# Patient Record
Sex: Female | Born: 1975 | Race: White | Hispanic: Yes | Marital: Single | Smoking: Current some day smoker
Health system: Southern US, Community
[De-identification: ages and names within clinical notes are randomized; demographics above are authoritative.]

## PROBLEM LIST (undated history)

## (undated) HISTORY — PX: TUBAL LIGATION: SHX77

---

## 2014-03-12 ENCOUNTER — Emergency Department: Payer: Self-pay | Admitting: Emergency Medicine

## 2014-03-21 ENCOUNTER — Emergency Department: Payer: Self-pay | Admitting: Emergency Medicine

## 2015-07-17 ENCOUNTER — Ambulatory Visit: Payer: Self-pay | Admitting: General Surgery

## 2015-07-30 ENCOUNTER — Ambulatory Visit: Payer: Self-pay | Admitting: General Surgery

## 2015-09-05 ENCOUNTER — Encounter: Payer: Self-pay | Admitting: *Deleted

## 2015-11-28 ENCOUNTER — Emergency Department: Payer: BLUE CROSS/BLUE SHIELD

## 2015-11-28 ENCOUNTER — Emergency Department
Admission: EM | Admit: 2015-11-28 | Discharge: 2015-11-28 | Disposition: A | Discharge status: Home / Self Care | Payer: BLUE CROSS/BLUE SHIELD | Attending: Student | Admitting: Student

## 2015-11-28 ENCOUNTER — Encounter: Payer: Self-pay | Admitting: *Deleted

## 2015-11-28 DIAGNOSIS — N12 Tubulo-interstitial nephritis, not specified as acute or chronic: Secondary | ICD-10-CM | POA: Diagnosis not present

## 2015-11-28 DIAGNOSIS — R000 Tachycardia, unspecified: Secondary | ICD-10-CM | POA: Insufficient documentation

## 2015-11-28 DIAGNOSIS — R519 Headache, unspecified: Secondary | ICD-10-CM

## 2015-11-28 DIAGNOSIS — R51 Headache: Secondary | ICD-10-CM | POA: Diagnosis not present

## 2015-11-28 DIAGNOSIS — Z3202 Encounter for pregnancy test, result negative: Secondary | ICD-10-CM | POA: Insufficient documentation

## 2015-11-28 DIAGNOSIS — R109 Unspecified abdominal pain: Secondary | ICD-10-CM

## 2015-11-28 LAB — COMPREHENSIVE METABOLIC PANEL
ALBUMIN: 3.7 g/dL (ref 3.5–5.0)
ALK PHOS: 65 U/L (ref 38–126)
ALT: 14 U/L (ref 14–54)
ANION GAP: 9 (ref 5–15)
AST: 17 U/L (ref 15–41)
BUN: 10 mg/dL (ref 6–20)
CALCIUM: 9.2 mg/dL (ref 8.9–10.3)
CO2: 27 mmol/L (ref 22–32)
CREATININE: 0.69 mg/dL (ref 0.44–1.00)
Chloride: 100 mmol/L — ABNORMAL LOW (ref 101–111)
GFR calc Af Amer: >60 mL/min (ref >60)
GFR calc non Af Amer: >60 mL/min (ref >60)
GLUCOSE: 103 mg/dL — AB (ref 65–99)
Potassium: 3.6 mmol/L (ref 3.5–5.1)
SODIUM: 136 mmol/L (ref 135–145)
Total Bilirubin: 1.2 mg/dL (ref 0.3–1.2)
Total Protein: 8 g/dL (ref 6.5–8.1)

## 2015-11-28 LAB — CBC
HCT: 38.5 % (ref 35.0–47.0)
HEMOGLOBIN: 13 g/dL (ref 12.0–16.0)
MCH: 29.7 pg (ref 26.0–34.0)
MCHC: 33.8 g/dL (ref 32.0–36.0)
MCV: 88 fL (ref 80.0–100.0)
Platelets: 201 10*3/uL (ref 150–440)
RBC: 4.38 MIL/uL (ref 3.80–5.20)
RDW: 13.5 % (ref 11.5–14.5)
WBC: 9.5 10*3/uL (ref 3.6–11.0)

## 2015-11-28 LAB — URINALYSIS COMPLETE WITH MICROSCOPIC (ARMC ONLY)
BACTERIA UA: NONE SEEN
Bilirubin Urine: NEGATIVE
Glucose, UA: NEGATIVE mg/dL
KETONES UR: NEGATIVE mg/dL
Nitrite: NEGATIVE
PH: 7 (ref 5.0–8.0)
PROTEIN: NEGATIVE mg/dL
SPECIFIC GRAVITY, URINE: 1.012 (ref 1.005–1.030)

## 2015-11-28 LAB — POCT PREGNANCY, URINE: Preg Test, Ur: NEGATIVE

## 2015-11-28 MED ORDER — ACETAMINOPHEN 500 MG PO TABS
1000.0000 mg | ORAL_TABLET | Freq: Once | ORAL | Status: AC
  Administered 2015-11-28: 1000 mg via ORAL
  Filled 2015-11-28: qty 2

## 2015-11-28 MED ORDER — IBUPROFEN 600 MG PO TABS
600.0000 mg | ORAL_TABLET | Freq: Once | ORAL | Status: AC
  Administered 2015-11-28: 600 mg via ORAL
  Filled 2015-11-28: qty 1

## 2015-11-28 MED ORDER — SODIUM CHLORIDE 0.9 % IV BOLUS (SEPSIS)
1000.0000 mL | Freq: Once | INTRAVENOUS | Status: AC
  Administered 2015-11-28: 1000 mL via INTRAVENOUS

## 2015-11-28 MED ORDER — DEXTROSE 5 % IV SOLN
1.0000 g | INTRAVENOUS | Status: DC
  Administered 2015-11-28: 1 g via INTRAVENOUS
  Filled 2015-11-28: qty 10

## 2015-11-28 MED ORDER — CEPHALEXIN 500 MG PO CAPS: 500.0000 mg | ORAL_CAPSULE | Freq: Two times a day (BID) | ORAL | Status: DC

## 2015-11-28 MED ORDER — DIPHENHYDRAMINE HCL 50 MG/ML IJ SOLN
12.5000 mg | Freq: Once | INTRAMUSCULAR | Status: AC
  Administered 2015-11-28: 12.5 mg via INTRAVENOUS
  Filled 2015-11-28: qty 1

## 2015-11-28 MED ORDER — METOCLOPRAMIDE HCL 5 MG/ML IJ SOLN
10.0000 mg | Freq: Once | INTRAMUSCULAR | Status: AC
  Administered 2015-11-28: 10 mg via INTRAVENOUS
  Filled 2015-11-28: qty 2

## 2015-11-28 NOTE — ED Provider Notes (Signed)
[facility] Emergency Department Provider Note  ____________________________________________  Time seen: Approximately 6:00 PM  I have reviewed the triage vital signs and the nursing notes.   HISTORY  Chief Complaint Headache and Fever  Caveat-history of present illness and review of systems limited due to Spanish language barrier which is overcome with the use of the Spanish interpreter.  HPI Erica Dawson is a 39 y.o. female with no chronic medical problems who presents for evaluation of sudden onset right flank pain, fevers today as well as global headache, gradual onset, constant since onset, currently severe, no modifying factors. Patient reports that she has had right flank pain in the past due to kidney infection. She denies injury, no bowel or bladder incontinence, no history of IV drug use, no history of malignancy, no numbness or weakness in any extremities. She is also complaining of headache and feels as if the pain in her right flank is causing the pain in her head. She specifically denies any neck pain or neck stiffness. No phonophobia or photophobia. No vomiting diarrhea fevers or chills. No chest pain or difficulty breathing.   History reviewed. No pertinent past medical history.  There are no active problems to display for this patient.   History reviewed. No pertinent past surgical history.  No current outpatient prescriptions on file.  Allergies Review of patient's allergies indicates no known allergies.  No family history on file.  Social History Social History  Substance Use Topics  . Smoking status: Never Smoker   . Smokeless tobacco: None  . Alcohol Use: No    Review of Systems Constitutional: + fever/chills Eyes: No visual changes. ENT: No sore throat. Cardiovascular: Denies chest pain. Respiratory: Denies shortness of breath. Gastrointestinal: No abdominal pain.  No nausea, no vomiting.  No diarrhea.  No  constipation. Genitourinary: Positive for flank pain Musculoskeletal: Negative for back pain. Skin: Negative for rash. Neurological: Positive for headaches, no focal weakness or numbness.  10-point ROS otherwise negative.  ____________________________________________   PHYSICAL EXAM:  VITAL SIGNS: ED Triage Vitals  Enc Vitals Group     BP 11/28/15 1655 123/78 mmHg     Pulse Rate 11/28/15 1655 114     Resp 11/28/15 1655 16     Temp 11/28/15 1655 100.3 F (37.9 C)     Temp Source 11/28/15 1655 Oral     SpO2 11/28/15 1655 99 %     Weight 11/28/15 1655 135 lb (61.236 kg)     Height 11/28/15 1655  (1.676 m)     Head Cir --      Peak Flow --      Pain Score 11/28/15 1655 10     Pain Loc --      Pain Edu? --      Excl. in GC? --     Constitutional: Alert and oriented. In mild distress second to pain. Eyes: Conjunctivae are normal. PERRL. EOMI. Head: Atraumatic. Nose: No congestion/rhinnorhea. Mouth/Throat: Mucous membranes are moist.  Oropharynx non-erythematous. Neck: No stridor.  Supple without meningismus. Cardiovascular: mildly tachycardic rate, regular rhythm. Grossly normal heart sounds.  Good peripheral circulation. Respiratory: Normal respiratory effort.  No retractions. Lungs CTAB. Gastrointestinal: Soft and nontender. No distention. + severe right CVA tenderness. Genitourinary: deferred Musculoskeletal: No lower extremity tenderness nor edema.  No joint effusions. Neurologic:  Normal speech and language. No gross focal neurologic deficits are appreciated. 5 out of 5 strength in bilateral upper and lower extremities, sensation intact to light touch throughout.  Skin:  Skin is warm, dry and intact. No rash noted. Psychiatric: Mood and affect are normal. Speech and behavior are normal.  ____________________________________________   LABS (all labs ordered are listed, but only abnormal results are displayed)  Labs Reviewed  COMPREHENSIVE METABOLIC PANEL -  Abnormal; Notable for the following:    Chloride 100 (*)    Glucose, Bld 103 (*)    All other components within normal limits  URINALYSIS COMPLETEWITH MICROSCOPIC ([facility] ONLY) - Abnormal; Notable for the following:    Color, Urine YELLOW (*)    APPearance HAZY (*)    Hgb urine dipstick 1+ (*)    Leukocytes, UA 1+ (*)    Squamous Epithelial / LPF 0-5 (*)    All other components within normal limits  URINE CULTURE  CULTURE, BLOOD (ROUTINE X 2)  CULTURE, BLOOD (ROUTINE X 2)  CBC  POC URINE PREG, ED  POCT PREGNANCY, URINE   ____________________________________________  EKG  none ____________________________________________  RADIOLOGY  CT head IMPRESSION: Negative noncontrast head CT.  CXR IMPRESSION: No acute chest process.  CT abdomen and pelvis IMPRESSION: Right hydroureteronephrosis without focal discrete obstructing stone identified. Differential diagnosis includes recently passed stone versus pyelonephritis. ____________________________________________   PROCEDURES  Procedure(s) performed: None  Critical Care performed: No  ____________________________________________   INITIAL IMPRESSION / ASSESSMENT AND PLAN / ED COURSE  Pertinent labs & imaging results that were available during my care of the patient were reviewed by me and considered in my medical decision making (see chart for details).  Erica Dawson is a 39 y.o. female with no chronic medical problems who presents for evaluation of sudden onset right flank pain, fevers today as well as global headache, gradual onset, constant since onset, currently severe, no modifying factors. On exam, she is in distress due to pain, nearly febrile and midly tachycardic. She has severe right-sided CVA tenderness  - plan UA to evaluate for pyelonephritis. No midline tenderness throughout the back, intact neuro exam, not concerning for cauda equinna and I doubt epidural abscess. Her neck is supple and she  specifically denies neck pain making meningitis less likely. Will treat her pain and obtain labs, UA, CT head. Will likely pursue CT renal protocol of the abdomen and pelvis given her severe right-sided flank pain.  ----------------------------------------- 11:20 PM on 11/28/2015 ----------------------------------------- Patient has had complete resolution of her flank pain and headache at this time after IV fluids, reglan, benadryl and motrin. She is sitting up in bed, requesting discharge. Tachycardia resolved, fever improved. Labs reviewed, normal CBC, CMP, negative pregnancy test, urinalysis was concerning for urinary tract infection or possible obstructing stone so CT of the abdomen and pelvis was obtained and is notable for right hydroureteronephrosis without an obstructing stone however findings could represent a recently passed stone versus pyelonephritis. She received IV ceftriaxone in the ER, she is tolerating by mouth intake. CT head was negative for any acute intracranial process. Chest x-ray clear. Suspect her headache was more related to her fever and general feeling unwell this evening. Clinical picture is not consistent with subarachnoid hemorrhage or meningitis and I do not think she requires a lumbar puncture. We discussed return precautions, plan of care, need for close follow-up with the assistance of this patient to her prior, all questions were answered and she is, with the discharge plan. DC home with keflex. ____________________________________________   FINAL CLINICAL IMPRESSION(S) / ED DIAGNOSES  Final diagnoses:  Acute right flank pain  Pyelonephritis  Acute nonintractable headache, unspecified headache type  Gayla Doss, MD 11/28/15 [phone]

## 2015-11-28 NOTE — ED Notes (Signed)
Patient transported to X-ray 

## 2015-11-28 NOTE — ED Notes (Signed)
Family at bedside. 

## 2015-11-28 NOTE — ED Notes (Signed)
Pt reports sudden onset of headache, fever, and neck /back pain

## 2015-11-28 NOTE — ED Notes (Signed)
Patient transported to CT 

## 2015-11-30 LAB — URINE CULTURE

## 2015-12-03 LAB — CULTURE, BLOOD (ROUTINE X 2)
Culture: NO GROWTH
Culture: NO GROWTH

## 2017-11-29 ENCOUNTER — Emergency Department
Admission: EM | Admit: 2017-11-29 | Discharge: 2017-11-29 | Disposition: A | Discharge status: Home / Self Care | Payer: Self-pay | Attending: Emergency Medicine | Admitting: Emergency Medicine

## 2017-11-29 ENCOUNTER — Emergency Department: Payer: Self-pay

## 2017-11-29 DIAGNOSIS — N83292 Other ovarian cyst, left side: Secondary | ICD-10-CM | POA: Insufficient documentation

## 2017-11-29 DIAGNOSIS — N83209 Unspecified ovarian cyst, unspecified side: Secondary | ICD-10-CM

## 2017-11-29 DIAGNOSIS — R102 Pelvic and perineal pain: Secondary | ICD-10-CM | POA: Insufficient documentation

## 2017-11-29 DIAGNOSIS — F17200 Nicotine dependence, unspecified, uncomplicated: Secondary | ICD-10-CM | POA: Insufficient documentation

## 2017-11-29 LAB — COMPREHENSIVE METABOLIC PANEL
ALBUMIN: 4.1 g/dL (ref 3.5–5.0)
ALT: 20 U/L (ref 14–54)
AST: 20 U/L (ref 15–41)
Alkaline Phosphatase: 72 U/L (ref 38–126)
Anion gap: 10 (ref 5–15)
BUN: 16 mg/dL (ref 6–20)
CHLORIDE: 106 mmol/L (ref 101–111)
CO2: 21 mmol/L — AB (ref 22–32)
CREATININE: 0.55 mg/dL (ref 0.44–1.00)
Calcium: 9 mg/dL (ref 8.9–10.3)
GFR calc Af Amer: >60 mL/min (ref >60)
GFR calc non Af Amer: >60 mL/min (ref >60)
Glucose, Bld: 100 mg/dL — ABNORMAL HIGH (ref 65–99)
Potassium: 4 mmol/L (ref 3.5–5.1)
Sodium: 137 mmol/L (ref 135–145)
Total Bilirubin: 0.5 mg/dL (ref 0.3–1.2)
Total Protein: 8.2 g/dL — ABNORMAL HIGH (ref 6.5–8.1)

## 2017-11-29 LAB — URINALYSIS, COMPLETE (UACMP) WITH MICROSCOPIC
BILIRUBIN URINE: NEGATIVE
Bacteria, UA: NONE SEEN
GLUCOSE, UA: NEGATIVE mg/dL
HGB URINE DIPSTICK: NEGATIVE
Ketones, ur: NEGATIVE mg/dL
Leukocytes, UA: NEGATIVE
Nitrite: NEGATIVE
PROTEIN: NEGATIVE mg/dL
RBC / HPF: NONE SEEN RBC/hpf (ref 0–5)
SPECIFIC GRAVITY, URINE: 1.019 (ref 1.005–1.030)
pH: 5 (ref 5.0–8.0)

## 2017-11-29 LAB — CBC
HCT: 40.7 % (ref 35.0–47.0)
Hemoglobin: 13.7 g/dL (ref 12.0–16.0)
MCH: 29.3 pg (ref 26.0–34.0)
MCHC: 33.7 g/dL (ref 32.0–36.0)
MCV: 87.2 fL (ref 80.0–100.0)
PLATELETS: 248 10*3/uL (ref 150–440)
RBC: 4.67 MIL/uL (ref 3.80–5.20)
RDW: 13.8 % (ref 11.5–14.5)
WBC: 10.4 10*3/uL (ref 3.6–11.0)

## 2017-11-29 LAB — POCT PREGNANCY, URINE: PREG TEST UR: NEGATIVE

## 2017-11-29 LAB — LIPASE, BLOOD: LIPASE: 22 U/L (ref 11–51)

## 2017-11-29 MED ORDER — IBUPROFEN 600 MG PO TABS: 600.0000 mg | ORAL_TABLET | Freq: Three times a day (TID) | ORAL | 0 refills | Status: DC | PRN

## 2017-11-29 MED ORDER — HYDROCODONE-ACETAMINOPHEN 5-325 MG PO TABS: 1.0000 | ORAL_TABLET | Freq: Four times a day (QID) | ORAL | 0 refills | Status: DC | PRN

## 2017-11-29 MED ORDER — IBUPROFEN 400 MG PO TABS
600.0000 mg | ORAL_TABLET | Freq: Once | ORAL | Status: DC
  Filled 2017-11-29: qty 2

## 2017-11-29 MED ORDER — HYDROCODONE-ACETAMINOPHEN 5-325 MG PO TABS
1.0000 | ORAL_TABLET | Freq: Once | ORAL | Status: AC
  Administered 2017-11-29: 1 via ORAL
  Filled 2017-11-29: qty 1

## 2017-11-29 MED ORDER — IBUPROFEN 800 MG PO TABS
800.0000 mg | ORAL_TABLET | Freq: Once | ORAL | Status: AC
  Administered 2017-11-29: 800 mg via ORAL

## 2017-11-29 MED ORDER — ONDANSETRON HCL 4 MG PO TABS: 4.0000 mg | ORAL_TABLET | Freq: Every day | ORAL | 0 refills | Status: AC | PRN

## 2017-11-29 NOTE — ED Notes (Signed)
Upon assessment pt reports lower abdominal pain that has been intermittent for days. Pt reports pain that started this morning at 1030 and has not stopped.

## 2017-11-29 NOTE — ED Triage Notes (Signed)
Pt presents via POV c/o lower abd pain into back. + nausea, denies emesis.

## 2017-11-29 NOTE — ED Provider Notes (Signed)
[facility] Emergency Department Provider Note  ____________________________________________   First MD Initiated Contact with Patient 11/29/17 1459     (approximate)  I have reviewed the triage vital signs and the nursing notes.   HISTORY  Chief Complaint Abdominal Pain    HPI Erica Dawson is a 41 y.o. female who self presents to the emergency department with severe left greater than right lower abdominal/pelvic pain that began suddenly at 1030 this morning.  The pain has been completely constant although does tend to come in waves which she describes as similar to her previous period.  She has a past medical history of having her tubes tied.  Her pain is severe and intermittent associated with nausea.  Nonradiating.  Nothing seems to make it better or worse.  History reviewed. No pertinent past medical history.  There are no active problems to display for this patient.   History reviewed. No pertinent surgical history.  Prior to Admission medications   Medication Sig Start Date End Date Taking? Authorizing Provider  cephALEXin (KEFLEX) 500 MG capsule Take 1 capsule (500 mg total) by mouth 2 (two) times daily. 11/28/15   Gayla DossGayle, Eryka A, MD    Allergies Patient has no known allergies.  History reviewed. No pertinent family history.  Social History Social History   Tobacco Use  . Smoking status: Current Some Day Smoker  . Smokeless tobacco: Never Used  Substance Use Topics  . Alcohol use: No  . Drug use: No    Review of Systems Constitutional: No fever/chills Eyes: No visual changes. ENT: No sore throat. Cardiovascular: Denies chest pain. Respiratory: Denies shortness of breath. Gastrointestinal: Positive for abdominal pain.  Positive for nausea, no vomiting.  No diarrhea.  No constipation. Genitourinary: Negative for dysuria. Musculoskeletal: Negative for back pain. Skin: Negative for rash. Neurological: Negative for  headaches, focal weakness or numbness.   ____________________________________________   PHYSICAL EXAM:  VITAL SIGNS: ED Triage Vitals  Enc Vitals Group     BP 11/29/17 1410 129/71     Pulse Rate 11/29/17 1410 84     Resp 11/29/17 1410 20     Temp 11/29/17 1410 98.1 F (36.7 C)     Temp Source 11/29/17 1410 Oral     SpO2 11/29/17 1410 98 %     Weight 11/29/17 1416 157 lb (71.2 kg)     Height 11/29/17 1416 5\' 6"  (1.676 m)     Head Circumference --      Peak Flow --      Pain Score 11/29/17 1415 10     Pain Loc --      Pain Edu? --      Excl. in GC? --     Constitutional: Alert and oriented x4 appears somewhat uncomfortable when sitting nontoxic no diaphoresis speaks in full clear sentences Eyes: PERRL EOMI. Head: Atraumatic. Nose: No congestion/rhinnorhea. Mouth/Throat: No trismus Neck: No stridor.   Cardiovascular: Normal rate, regular rhythm. Grossly normal heart sounds.  Good peripheral circulation. Respiratory: Normal respiratory effort.  No retractions. Lungs CTAB and moving good air Gastrointestinal: Soft nondistended she is tender left lower quadrant with negative Rovsing's no frank peritonitis Musculoskeletal: No lower extremity edema   Neurologic:  Normal speech and language. No gross focal neurologic deficits are appreciated. Skin:  Skin is warm, dry and intact. No rash noted. Psychiatric: Mood and affect are normal. Speech and behavior are normal.    ____________________________________________   DIFFERENTIAL includes but not limited to  Ovarian  torsion, ovarian cyst, diverticulitis, pyelonephritis, nephrolithiasis ____________________________________________   LABS (all labs ordered are listed, but only abnormal results are displayed)  Labs Reviewed  COMPREHENSIVE METABOLIC PANEL - Abnormal; Notable for the following components:      Result Value   CO2 21 (*)    Glucose, Bld 100 (*)    Total Protein 8.2 (*)    All other components within normal  limits  URINALYSIS, COMPLETE (UACMP) WITH MICROSCOPIC - Abnormal; Notable for the following components:   Color, Urine YELLOW (*)    APPearance CLEAR (*)    Squamous Epithelial / LPF 0-5 (*)    All other components within normal limits  LIPASE, BLOOD  CBC  POCT PREGNANCY, URINE  POC URINE PREG, ED    Blood work reviewed by me with no acute disease __________________________________________  EKG   ____________________________________________  RADIOLOGY  Pelvic ultrasound reviewed by me shows hemorrhagic left-sided ovarian cyst with good blood flow ____________________________________________   PROCEDURES  Procedure(s) performed: no  Procedures  Critical Care performed: no  Observation: no ____________________________________________   INITIAL IMPRESSION / ASSESSMENT AND PLAN / ED COURSE  Pertinent labs & imaging results that were available during my care of the patient were reviewed by me and considered in my medical decision making (see chart for details).  On arrival the patient has colicky left-sided he does not desire any more children.  She has no frank peritonitis but is quite tender.  Concerns for ovarian pathology.  Ultrasound is pending along with pain medication.  ----------------------------------------- 5:44 PM on 11/29/2017 -----------------------------------------  Fortunately the patient's pain is nearly resolved.  Her ultrasound shows a hemorrhagic left-sided ovarian cyst with good blood flow.  At this point as her pain is well controlled I will treat her symptomatically with opioids and nonsteroidals and refer her back to primary care.  Strict return precautions have been given and the patient verbalized understanding and agreement the plan.      ____________________________________________   FINAL CLINICAL IMPRESSION(S) / ED DIAGNOSES  Final diagnoses:  None      NEW MEDICATIONS STARTED DURING THIS VISIT:  This SmartLink is  deprecated. Use AVSMEDLIST instead to display the medication list for a patient.   Note:  This document was prepared using Dragon voice recognition software and may include unintentional dictation errors.     Merrily Brittleifenbark, [place] Virrueta, MD 11/29/17 1745

## 2017-11-29 NOTE — Discharge Instructions (Signed)
It is normal for your pain the last 2-3 more days.  Please take your pain medication as needed for severe symptoms and establish care with primary care within 1 week for recheck.  Return to the emergency department sooner for any new or worsening symptoms such as worsening pain, fevers or chills, if you cannot eat or drink, or for any other issues whatsoever.  It was a pleasure to take care of you today, and thank you for coming to our emergency department.  If you have any questions or concerns before leaving please ask the nurse to grab me and I'm more than happy to go through your aftercare instructions again.  If you were prescribed any opioid pain medication today such as Norco, Vicodin, Percocet, morphine, hydrocodone, or oxycodone please make sure you do not drive when you are taking this medication as it can alter your ability to drive safely.  If you have any concerns once you are home that you are not improving or are in fact getting worse before you can make it to your follow-up appointment, please do not hesitate to call 911 and come back for further evaluation.  Merrily Brittle, MD  Results for orders placed or performed during the hospital encounter of 11/29/17  Lipase, blood  Result Value Ref Range   Lipase 22 11 - 51 U/L  Comprehensive metabolic panel  Result Value Ref Range   Sodium 137 135 - 145 mmol/L   Potassium 4.0 3.5 - 5.1 mmol/L   Chloride 106 101 - 111 mmol/L   CO2 21 (L) 22 - 32 mmol/L   Glucose, Bld 100 (H) 65 - 99 mg/dL   BUN 16 6 - 20 mg/dL   Creatinine, Ser 1.61 0.44 - 1.00 mg/dL   Calcium 9.0 8.9 - 09.6 mg/dL   Total Protein 8.2 (H) 6.5 - 8.1 g/dL   Albumin 4.1 3.5 - 5.0 g/dL   AST 20 15 - 41 U/L   ALT 20 14 - 54 U/L   Alkaline Phosphatase 72 38 - 126 U/L   Total Bilirubin 0.5 0.3 - 1.2 mg/dL   GFR calc non Af Amer >60 >60 mL/min   GFR calc Af Amer >60 >60 mL/min   Anion gap 10 5 - 15  CBC  Result Value Ref Range   WBC 10.4 3.6 - 11.0 K/uL   RBC 4.67  3.80 - 5.20 MIL/uL   Hemoglobin 13.7 12.0 - 16.0 g/dL   HCT 04.5 40.9 - 81.1 %   MCV 87.2 80.0 - 100.0 fL   MCH 29.3 26.0 - 34.0 pg   MCHC 33.7 32.0 - 36.0 g/dL   RDW 91.4 78.2 - 95.6 %   Platelets 248 150 - 440 K/uL  Urinalysis, Complete w Microscopic  Result Value Ref Range   Color, Urine YELLOW (A) YELLOW   APPearance CLEAR (A) CLEAR   Specific Gravity, Urine 1.019 1.005 - 1.030   pH 5.0 5.0 - 8.0   Glucose, UA NEGATIVE NEGATIVE mg/dL   Hgb urine dipstick NEGATIVE NEGATIVE   Bilirubin Urine NEGATIVE NEGATIVE   Ketones, ur NEGATIVE NEGATIVE mg/dL   Protein, ur NEGATIVE NEGATIVE mg/dL   Nitrite NEGATIVE NEGATIVE   Leukocytes, UA NEGATIVE NEGATIVE   RBC / HPF NONE SEEN 0 - 5 RBC/hpf   WBC, UA 0-5 0 - 5 WBC/hpf   Bacteria, UA NONE SEEN NONE SEEN   Squamous Epithelial / LPF 0-5 (A) NONE SEEN   Mucus PRESENT   Pregnancy, urine POC  Result  Value Ref Range   Preg Test, Ur NEGATIVE NEGATIVE   [place] Transvaginal Non-ob  Result Date: 11/29/2017 CLINICAL DATA:  Colicky left lower quadrant pain. EXAM: TRANSABDOMINAL AND TRANSVAGINAL ULTRASOUND OF PELVIS DOPPLER ULTRASOUND OF OVARIES TECHNIQUE: Both transabdominal and transvaginal ultrasound examinations of the pelvis were performed. Transabdominal technique was performed for global imaging of the pelvis including uterus, ovaries, adnexal regions, and pelvic cul-de-sac. It was necessary to proceed with endovaginal exam following the transabdominal exam to visualize the ovaries and endometrium. Color and duplex Doppler ultrasound was utilized to evaluate blood flow to the ovaries. COMPARISON:  None. FINDINGS: Uterus Measurements: 7.8 x 5.0 x 5.6 cm. Probable posterior fibroid measuring 2.2 x 2.5 x 2.6 cm. Endometrium Thickness: 11 mm.  No focal abnormality visualized. Right ovary Measurements: 2.4 x 0.9 x 2.8 cm. Normal appearance/no adnexal mass. Left ovary Measurements: 3.4 x 1.5 x 2.6 cm. Rounded hypoechoic region measuring 1.5 cm with peripheral  blood flow, likely a corpus luteum cyst. Pulsed Doppler evaluation of both ovaries demonstrates normal low-resistance arterial and venous waveforms. Other findings No abnormal free fluid. IMPRESSION: 1. 1.5 cm rounded hypoechoic region with peripheral blood flow in the left ovary is likely a corpus luteum or hemorrhagic cyst. A 6-12 week follow-up ultrasound could ensure resolution. The left ovary is otherwise normal with arterial and venous blood flow. Normal size. 2. The right ovary is somewhat difficult to evaluate due to its posterior location. However, it demonstrates no significant abnormalities. 3. Probable 2.6 cm fibroid. Electronically Signed   By: Gerome Samavid  Williams III M.D   On: 11/29/2017 17:30   [place] Pelvis Complete  Result Date: 11/29/2017 CLINICAL DATA:  Colicky left lower quadrant pain. EXAM: TRANSABDOMINAL AND TRANSVAGINAL ULTRASOUND OF PELVIS DOPPLER ULTRASOUND OF OVARIES TECHNIQUE: Both transabdominal and transvaginal ultrasound examinations of the pelvis were performed. Transabdominal technique was performed for global imaging of the pelvis including uterus, ovaries, adnexal regions, and pelvic cul-de-sac. It was necessary to proceed with endovaginal exam following the transabdominal exam to visualize the ovaries and endometrium. Color and duplex Doppler ultrasound was utilized to evaluate blood flow to the ovaries. COMPARISON:  None. FINDINGS: Uterus Measurements: 7.8 x 5.0 x 5.6 cm. Probable posterior fibroid measuring 2.2 x 2.5 x 2.6 cm. Endometrium Thickness: 11 mm.  No focal abnormality visualized. Right ovary Measurements: 2.4 x 0.9 x 2.8 cm. Normal appearance/no adnexal mass. Left ovary Measurements: 3.4 x 1.5 x 2.6 cm. Rounded hypoechoic region measuring 1.5 cm with peripheral blood flow, likely a corpus luteum cyst. Pulsed Doppler evaluation of both ovaries demonstrates normal low-resistance arterial and venous waveforms. Other findings No abnormal free fluid. IMPRESSION: 1. 1.5 cm  rounded hypoechoic region with peripheral blood flow in the left ovary is likely a corpus luteum or hemorrhagic cyst. A 6-12 week follow-up ultrasound could ensure resolution. The left ovary is otherwise normal with arterial and venous blood flow. Normal size. 2. The right ovary is somewhat difficult to evaluate due to its posterior location. However, it demonstrates no significant abnormalities. 3. Probable 2.6 cm fibroid. Electronically Signed   By: Gerome Samavid  Williams III M.D   On: 11/29/2017 17:30   [place] Art/ven Flow Abd Pelv Doppler  Result Date: 11/29/2017 CLINICAL DATA:  Colicky left lower quadrant pain. EXAM: TRANSABDOMINAL AND TRANSVAGINAL ULTRASOUND OF PELVIS DOPPLER ULTRASOUND OF OVARIES TECHNIQUE: Both transabdominal and transvaginal ultrasound examinations of the pelvis were performed. Transabdominal technique was performed for global imaging of the pelvis including uterus, ovaries, adnexal regions, and pelvic cul-de-sac. It was necessary  to proceed with endovaginal exam following the transabdominal exam to visualize the ovaries and endometrium. Color and duplex Doppler ultrasound was utilized to evaluate blood flow to the ovaries. COMPARISON:  None. FINDINGS: Uterus Measurements: 7.8 x 5.0 x 5.6 cm. Probable posterior fibroid measuring 2.2 x 2.5 x 2.6 cm. Endometrium Thickness: 11 mm.  No focal abnormality visualized. Right ovary Measurements: 2.4 x 0.9 x 2.8 cm. Normal appearance/no adnexal mass. Left ovary Measurements: 3.4 x 1.5 x 2.6 cm. Rounded hypoechoic region measuring 1.5 cm with peripheral blood flow, likely a corpus luteum cyst. Pulsed Doppler evaluation of both ovaries demonstrates normal low-resistance arterial and venous waveforms. Other findings No abnormal free fluid. IMPRESSION: 1. 1.5 cm rounded hypoechoic region with peripheral blood flow in the left ovary is likely a corpus luteum or hemorrhagic cyst. A 6-12 week follow-up ultrasound could ensure resolution. The left ovary is  otherwise normal with arterial and venous blood flow. Normal size. 2. The right ovary is somewhat difficult to evaluate due to its posterior location. However, it demonstrates no significant abnormalities. 3. Probable 2.6 cm fibroid. Electronically Signed   By: Gerome Samavid  Williams III M.D   On: 11/29/2017 17:30

## 2018-05-12 ENCOUNTER — Ambulatory Visit: Payer: Self-pay

## 2018-07-14 ENCOUNTER — Ambulatory Visit: Payer: Self-pay | Attending: Oncology

## 2020-01-22 ENCOUNTER — Inpatient Hospital Stay
Admission: EM | Admit: 2020-01-22 | Discharge: 2020-01-25 | DRG: 494 | Disposition: A | Discharge status: Home Health | Payer: Self-pay | Attending: Specialist | Admitting: Specialist

## 2020-01-22 ENCOUNTER — Emergency Department: Payer: Self-pay

## 2020-01-22 ENCOUNTER — Other Ambulatory Visit: Payer: Self-pay

## 2020-01-22 DIAGNOSIS — Y92009 Unspecified place in unspecified non-institutional (private) residence as the place of occurrence of the external cause: Secondary | ICD-10-CM

## 2020-01-22 DIAGNOSIS — S82232A Displaced oblique fracture of shaft of left tibia, initial encounter for closed fracture: Principal | ICD-10-CM | POA: Diagnosis present

## 2020-01-22 DIAGNOSIS — F17200 Nicotine dependence, unspecified, uncomplicated: Secondary | ICD-10-CM | POA: Diagnosis present

## 2020-01-22 DIAGNOSIS — W109XXA Fall (on) (from) unspecified stairs and steps, initial encounter: Secondary | ICD-10-CM | POA: Diagnosis present

## 2020-01-22 DIAGNOSIS — S82302A Unspecified fracture of lower end of left tibia, initial encounter for closed fracture: Secondary | ICD-10-CM

## 2020-01-22 DIAGNOSIS — Z419 Encounter for procedure for purposes other than remedying health state, unspecified: Secondary | ICD-10-CM

## 2020-01-22 DIAGNOSIS — Z20822 Contact with and (suspected) exposure to covid-19: Secondary | ICD-10-CM | POA: Diagnosis present

## 2020-01-22 DIAGNOSIS — S82202A Unspecified fracture of shaft of left tibia, initial encounter for closed fracture: Secondary | ICD-10-CM | POA: Diagnosis present

## 2020-01-22 DIAGNOSIS — S82832A Other fracture of upper and lower end of left fibula, initial encounter for closed fracture: Secondary | ICD-10-CM | POA: Diagnosis present

## 2020-01-22 LAB — URINALYSIS, ROUTINE W REFLEX MICROSCOPIC
Bacteria, UA: NONE SEEN
Bilirubin Urine: NEGATIVE
Glucose, UA: NEGATIVE mg/dL
Hgb urine dipstick: NEGATIVE
Ketones, ur: 20 mg/dL — AB
Leukocytes,Ua: NEGATIVE
Nitrite: NEGATIVE
Protein, ur: 100 mg/dL — AB
Specific Gravity, Urine: 1.028 (ref 1.005–1.030)
pH: 6 (ref 5.0–8.0)

## 2020-01-22 LAB — CBC WITH DIFFERENTIAL/PLATELET
Abs Immature Granulocytes: 0.03 10*3/uL (ref 0.00–0.07)
Basophils Absolute: 0 10*3/uL (ref 0.0–0.1)
Basophils Relative: 0 %
Eosinophils Absolute: 0.1 10*3/uL (ref 0.0–0.5)
Eosinophils Relative: 1 %
HCT: 41.2 % (ref 36.0–46.0)
Hemoglobin: 13.3 g/dL (ref 12.0–15.0)
Immature Granulocytes: 0 %
Lymphocytes Relative: 17 %
Lymphs Abs: 1.5 10*3/uL (ref 0.7–4.0)
MCH: 28.7 pg (ref 26.0–34.0)
MCHC: 32.3 g/dL (ref 30.0–36.0)
MCV: 88.8 fL (ref 80.0–100.0)
Monocytes Absolute: 0.7 10*3/uL (ref 0.1–1.0)
Monocytes Relative: 8 %
Neutro Abs: 6.6 10*3/uL (ref 1.7–7.7)
Neutrophils Relative %: 74 %
Platelets: 247 10*3/uL (ref 150–400)
RBC: 4.64 MIL/uL (ref 3.87–5.11)
RDW: 13.2 % (ref 11.5–15.5)
WBC: 9 10*3/uL (ref 4.0–10.5)
nRBC: 0 % (ref 0.0–0.2)

## 2020-01-22 LAB — BASIC METABOLIC PANEL
Anion gap: 9 (ref 5–15)
BUN: 10 mg/dL (ref 6–20)
CO2: 22 mmol/L (ref 22–32)
Calcium: 8.6 mg/dL — ABNORMAL LOW (ref 8.9–10.3)
Chloride: 106 mmol/L (ref 98–111)
Creatinine, Ser: 0.59 mg/dL (ref 0.44–1.00)
GFR calc Af Amer: >60 mL/min (ref >60)
GFR calc non Af Amer: >60 mL/min (ref >60)
Glucose, Bld: 105 mg/dL — ABNORMAL HIGH (ref 70–99)
Potassium: 4 mmol/L (ref 3.5–5.1)
Sodium: 137 mmol/L (ref 135–145)

## 2020-01-22 LAB — PROTIME-INR
INR: 1 (ref 0.8–1.2)
Prothrombin Time: 13.1 seconds (ref 11.4–15.2)

## 2020-01-22 LAB — TYPE AND SCREEN
ABO/RH(D): O POS
Antibody Screen: NEGATIVE

## 2020-01-22 LAB — SARS CORONAVIRUS 2 (TAT 6-24 HRS): SARS Coronavirus 2: NEGATIVE

## 2020-01-22 MED ORDER — PREGABALIN 75 MG PO CAPS
75.0000 mg | ORAL_CAPSULE | ORAL | Status: AC
  Administered 2020-01-23: 75 mg via ORAL
  Filled 2020-01-22: qty 1

## 2020-01-22 MED ORDER — SODIUM CHLORIDE 0.9 % IV SOLN: INTRAVENOUS | Status: DC

## 2020-01-22 MED ORDER — CEFAZOLIN SODIUM-DEXTROSE 2-4 GM/100ML-% IV SOLN: 2.0000 g | INTRAVENOUS | Status: DC

## 2020-01-22 MED ORDER — ONDANSETRON HCL 4 MG/2ML IJ SOLN
4.0000 mg | Freq: Once | INTRAMUSCULAR | Status: AC
  Administered 2020-01-22: 4 mg via INTRAVENOUS
  Filled 2020-01-22: qty 2

## 2020-01-22 MED ORDER — CLINDAMYCIN PHOSPHATE 600 MG/50ML IV SOLN
600.0000 mg | INTRAVENOUS | Status: DC
  Filled 2020-01-22: qty 50

## 2020-01-22 MED ORDER — MORPHINE SULFATE (PF) 4 MG/ML IV SOLN
4.0000 mg | Freq: Once | INTRAVENOUS | Status: AC
  Administered 2020-01-22: 4 mg via INTRAVENOUS
  Filled 2020-01-22: qty 1

## 2020-01-22 MED ORDER — ACETAMINOPHEN-CODEINE 120-12 MG/5ML PO SOLN: 24.0000 mg | ORAL | Status: DC | PRN

## 2020-01-22 MED ORDER — CEFAZOLIN SODIUM-DEXTROSE 2-4 GM/100ML-% IV SOLN
2.0000 g | INTRAVENOUS | Status: AC
  Administered 2020-01-23: 2 g via INTRAVENOUS
  Filled 2020-01-22: qty 100

## 2020-01-22 MED ORDER — HYDROCODONE-ACETAMINOPHEN 5-325 MG PO TABS: 1.0000 | ORAL_TABLET | ORAL | Status: DC | PRN

## 2020-01-22 MED ORDER — POVIDONE-IODINE 10 % EX SWAB: 2.0000 "application " | Freq: Once | CUTANEOUS | Status: DC

## 2020-01-22 MED ORDER — CHLORHEXIDINE GLUCONATE 4 % EX LIQD: 60.0000 mL | Freq: Once | CUTANEOUS | Status: DC

## 2020-01-22 MED ORDER — MORPHINE SULFATE (PF) 2 MG/ML IV SOLN
1.0000 mg | INTRAVENOUS | Status: DC | PRN
  Administered 2020-01-22: 1 mg via INTRAVENOUS
  Filled 2020-01-22: qty 1

## 2020-01-22 MED ORDER — OXYCODONE-ACETAMINOPHEN 5-325 MG PO TABS
1.0000 | ORAL_TABLET | Freq: Once | ORAL | Status: AC
  Administered 2020-01-22: 1 via ORAL
  Filled 2020-01-22: qty 1

## 2020-01-22 NOTE — ED Triage Notes (Signed)
Pt states she missed a step and injured her left ankle today.

## 2020-01-22 NOTE — H&P (Signed)
PREOPERATIVE H&P  Chief Complaint: Left leg pain HPI: Erica Dawson is a 44 y.o. female who presents for preoperative history and physical with a diagnosis of displaced left tibial shaft and fibular shaft fractures.  Patient apparently fell at home during the night and was brought to the emergency room today where exam and x-rays revealed a distal tibial fracture at the junction of the middle and distal thirds with displacement and rotation.  Fracture was reduced under sedation by the emergency room personnel and splinted.  This is an unstable fracture and would be best treated with a intramedullary rodding.  I discussed this with the patient and she agrees to the procedure.    History reviewed. No pertinent past medical history. History reviewed. No pertinent surgical history. Social History   Socioeconomic History  . Marital status: Married    Spouse name: Not on file  . Number of children: Not on file  . Years of education: Not on file  . Highest education level: Not on file  Occupational History  . Not on file  Tobacco Use  . Smoking status: Current Some Day Smoker  . Smokeless tobacco: Never Used  Substance and Sexual Activity  . Alcohol use: No  . Drug use: No  . Sexual activity: Not on file  Other Topics Concern  . Not on file  Social History Narrative  . Not on file   Social Determinants of Health   Financial Resource Strain:   . Difficulty of Paying Living Expenses: Not on file  Food Insecurity:   . Worried About Programme researcher, broadcasting/film/video in the Last Year: Not on file  . Ran Out of Food in the Last Year: Not on file  Transportation Needs:   . Lack of Transportation (Medical): Not on file  . Lack of Transportation (Non-Medical): Not on file  Physical Activity:   . Days of Exercise per Week: Not on file  . Minutes of Exercise per Session: Not on file  Stress:   . Feeling of Stress : Not on file  Social Connections:   . Frequency of Communication with Friends  and Family: Not on file  . Frequency of Social Gatherings with Friends and Family: Not on file  . Attends Religious Services: Not on file  . Active Member of Clubs or Organizations: Not on file  . Attends Banker Meetings: Not on file  . Marital Status: Not on file   No family history on file. No Known Allergies Prior to Admission medications   Not on File     Positive ROS: All other systems have been reviewed and were otherwise negative with the exception of those mentioned in the HPI and as above.  Physical Exam: General: Alert, no acute distress Cardiovascular: No pedal edema. Heart is regular and without murmur.  Respiratory: No cyanosis, no use of accessory musculature. Lungs are clear. GI: No organomegaly, abdomen is soft and non-tender Skin: No lesions in the area of chief complaint Neurologic: Sensation intact distally Psychiatric: Patient is competent for consent with normal mood and affect Lymphatic: No axillary or cervical lymphadenopathy  MUSCULOSKELETAL: Patient is alert and cooperative.  She is lying in the hospital bed.  The left lower extremity is splinted.  Skin is intact.  Neurovascular status is intact distally.  She moves the toes well.  Right lower extremity and upper extremities are normal.  Chest and spine are normal.  Assessment: Displaced left tibia/fibula fractures  Plan: Plan for intramedullary rodding of the  left tibia tomorrow morning.  She will be kept n.p.o. after midnight and will get IV antibiotics preoperatively.  The risks benefits and alternatives were discussed with the patient including but not limited to the risks of nonoperative treatment, versus surgical intervention including infection, bleeding, nerve injury,  blood clots, cardiopulmonary complications, morbidity, mortality, among others, and they were willing to proceed.   [name], MD [phone]   01/22/2020 6:57 PM

## 2020-01-22 NOTE — ED Provider Notes (Signed)
[facility] Emergency Department Provider Note  ____________________________________________   None    (approximate)  I have reviewed the triage vital signs and the nursing notes.   HISTORY  Chief Complaint Ankle Injury    HPI Erica Dawson is a 44 y.o. female patient states she slipped and fell.  Notes about 4 AM this morning.  She is unable to bear weight.  Complaining of ankle and left lower leg pain.  No other injuries reported.    History reviewed. No pertinent past medical history.  Patient Active Problem List   Diagnosis Date Noted  . Closed left tibial fracture 01/22/2020    History reviewed. No pertinent surgical history.  Prior to Admission medications   Not on File    Allergies Patient has no known allergies.  No family history on file.  Social History Social History   Tobacco Use  . Smoking status: Current Some Day Smoker  . Smokeless tobacco: Never Used  Substance Use Topics  . Alcohol use: No  . Drug use: No    Review of Systems  Constitutional: No fever/chills Eyes: No visual changes. ENT: No sore throat. Respiratory: Denies cough Genitourinary: Negative for dysuria. Musculoskeletal: Negative for back pain.  Positive for left leg pain Skin: Negative for rash. Psychiatric: no mood changes,     ____________________________________________   PHYSICAL EXAM:  VITAL SIGNS: ED Triage Vitals  Enc Vitals Group     BP 01/22/20 1247 121/77     Pulse Rate 01/22/20 1247 86     Resp 01/22/20 1247 16     Temp 01/22/20 1247 98.1 F (36.7 C)     Temp Source 01/22/20 1247 Oral     SpO2 01/22/20 1247 100 %     Weight 01/22/20 1246 155 lb (70.3 kg)     Height 01/22/20 1246 5\' 6"  (1.676 m)     Head Circumference --      Peak Flow --      Pain Score 01/22/20 1245 10     Pain Loc --      Pain Edu? --      Excl. in GC? --     Constitutional: Alert and oriented. Well appearing and in no acute  distress. Eyes: Conjunctivae are normal.  Head: Atraumatic. Nose: No congestion/rhinnorhea. Mouth/Throat: Mucous membranes are moist.   Neck:  supple no lymphadenopathy noted Cardiovascular: Normal rate, regular rhythm. Heart sounds are normal Respiratory: Normal respiratory effort.  No retractions, lungs c t a  GU: deferred Musculoskeletal: Decreased range of motion of the left tib-fib.  Foot is rotated out, area near the ankle and proximal area of the lower extremity are both tender, neurovascular is intact  neurologic:  Normal speech and language.  Skin:  Skin is warm, dry and intact. No rash noted. Psychiatric: Mood and affect are normal. Speech and behavior are normal.  ____________________________________________   LABS (all labs ordered are listed, but only abnormal results are displayed)  Labs Reviewed  BASIC METABOLIC PANEL - Abnormal; Notable for the following components:      Result Value   Glucose, Bld 105 (*)    Calcium 8.6 (*)    All other components within normal limits  SARS CORONAVIRUS 2 (TAT 6-24 HRS)  CBC WITH DIFFERENTIAL/PLATELET   ____________________________________________   ____________________________________________  RADIOLOGY  X-ray of the left tib-fib shows fracture of the distal tibia which is shifted and the proximal fibula which is also shifted both are through the diaphysis  ____________________________________________  PROCEDURES  Procedure(s) performed: Saline lock, morphine 4 mg IV x2, Percocet 1 p.o.  Lower extremity was splinted after realigning the lower leg, patient tolerated procedure well, neurovascular post splint application  Procedures    ____________________________________________   INITIAL IMPRESSION / ASSESSMENT AND PLAN / ED COURSE  Pertinent labs & imaging results that were available during my care of the patient were reviewed by me and considered in my medical decision making (see chart for details).    Patient's 43 year old female presents emergency department after a fall.  See HPI  Physical exam shows the patient to be tender on the left lower extremity.  X-ray shows a fracture of the left distal tibia and proximal fibula  Paged Dr. Sabra Heck to let them know it looks like an unstable fracture.  He will be taking her to surgery for tomorrow morning.  He is placing orders for admission.  Conveyed all the information to the patient. Dr. Corky Downs in  to help reduce/realign the lower extremity  Patient's blood work shows CBC and metabolic panel are basically normal. Stat Covid ordered due to admission and surgery.     Erica Dawson was evaluated in Emergency Department on 01/22/2020 for the symptoms described in the history of present illness. She was evaluated in the context of the global COVID-19 pandemic, which necessitated consideration that the patient might be at risk for infection with the SARS-CoV-2 virus that causes COVID-19. Institutional protocols and algorithms that pertain to the evaluation of patients at risk for COVID-19 are in a state of rapid change based on information released by regulatory bodies including the CDC and federal and state organizations. These policies and algorithms were followed during the patient's care in the ED.   As part of my medical decision making, I reviewed the following data within the [place] notes reviewed and incorporated, Interpreter needed, Labs reviewed see above, Old chart reviewed, Radiograph reviewed see above, A consult was requested and obtained from this/these consultant(s) Orthopedics, Evaluated by EM attending Dr. Corky Downs, Notes from prior ED visits and [place] Controlled Substance Database  ____________________________________________   FINAL CLINICAL IMPRESSION(S) / ED DIAGNOSES  Final diagnoses:  Closed fracture of distal end of left tibia, unspecified fracture morphology, initial encounter  Closed  fracture of proximal end of left fibula, unspecified fracture morphology, initial encounter      NEW MEDICATIONS STARTED DURING THIS VISIT:  New Prescriptions   No medications on file     Note:  This document was prepared using Dragon voice recognition software and may include unintentional dictation errors.    Versie Starks, PA-C 01/22/20 1512    Vanessa High Aman[place], MD 01/23/20 1754

## 2020-01-22 NOTE — ED Notes (Signed)
See triage note  Presents s/p fall  States she missed a step about 4 am  Unable to bear wt  Good pulses   Pain is at ankle and lower leg

## 2020-01-22 NOTE — ED Provider Notes (Signed)
.  Ortho Injury Treatment  Date/Time: 01/22/2020 3:12 PM Performed by: Jene Every, MD Authorized by: Jene Every, MD   Consent:    Consent obtained:  Verbal   Consent given by:  Patient   Risks discussed:  Nerve damageInjury location: lower leg Injury type: fracture Fracture type: tibial and fibular shafts Pre-procedure distal perfusion: normal Pre-procedure neurological function: normal Pre-procedure range of motion: normal  Anesthesia: Local anesthesia used: no  Patient sedated: NoManipulation performed: yes Reduction successful: yes Immobilization: splint Splint type: sugar tong Supplies used: Ortho-Glass Post-procedure neurovascular assessment: post-procedure neurovascularly intact Post-procedure distal perfusion: normal Post-procedure neurological function: normal Post-procedure range of motion: normal       Jene Every, MD 01/22/20 1513

## 2020-01-23 ENCOUNTER — Inpatient Hospital Stay: Payer: Self-pay

## 2020-01-23 ENCOUNTER — Inpatient Hospital Stay: Payer: Self-pay | Admitting: Anesthesiology

## 2020-01-23 ENCOUNTER — Encounter: Payer: Self-pay | Admitting: Specialist

## 2020-01-23 ENCOUNTER — Encounter
Admission: EM | Disposition: A | Discharge status: Home Health | Payer: Self-pay | Source: Home / Self Care | Attending: Specialist

## 2020-01-23 HISTORY — PX: TIBIA IM NAIL INSERTION: SHX2516

## 2020-01-23 LAB — CREATININE, SERUM
Creatinine, Ser: 0.6 mg/dL (ref 0.44–1.00)
GFR calc Af Amer: >60 mL/min (ref >60)
GFR calc non Af Amer: >60 mL/min (ref >60)

## 2020-01-23 LAB — CBC
HCT: 41.7 % (ref 36.0–46.0)
Hemoglobin: 13.2 g/dL (ref 12.0–15.0)
MCH: 29.1 pg (ref 26.0–34.0)
MCHC: 31.7 g/dL (ref 30.0–36.0)
MCV: 92.1 fL (ref 80.0–100.0)
Platelets: 222 10*3/uL (ref 150–400)
RBC: 4.53 MIL/uL (ref 3.87–5.11)
RDW: 13.3 % (ref 11.5–15.5)
WBC: 10.5 10*3/uL (ref 4.0–10.5)
nRBC: 0 % (ref 0.0–0.2)

## 2020-01-23 SURGERY — INSERTION, INTRAMEDULLARY ROD, TIBIA
Anesthesia: Spinal | Site: Leg Lower | Laterality: Left

## 2020-01-23 MED ORDER — KETOROLAC TROMETHAMINE 30 MG/ML IJ SOLN
INTRAMUSCULAR | Status: AC
  Filled 2020-01-23: qty 1

## 2020-01-23 MED ORDER — ONDANSETRON HCL 4 MG PO TABS: 4.0000 mg | ORAL_TABLET | Freq: Four times a day (QID) | ORAL | Status: DC | PRN

## 2020-01-23 MED ORDER — METHOCARBAMOL 1000 MG/10ML IJ SOLN
500.0000 mg | Freq: Four times a day (QID) | INTRAVENOUS | Status: DC | PRN
  Filled 2020-01-23: qty 5

## 2020-01-23 MED ORDER — BUPIVACAINE-EPINEPHRINE 0.25% -1:200000 IJ SOLN
INTRAMUSCULAR | Status: DC | PRN
  Administered 2020-01-23: 30 mL

## 2020-01-23 MED ORDER — BUPIVACAINE IN DEXTROSE 0.75-8.25 % IT SOLN
INTRATHECAL | Status: DC | PRN
  Administered 2020-01-23: 1.4 mL via INTRATHECAL

## 2020-01-23 MED ORDER — BISACODYL 10 MG RE SUPP: 10.0000 mg | Freq: Every day | RECTAL | Status: DC | PRN

## 2020-01-23 MED ORDER — ACETAMINOPHEN 10 MG/ML IV SOLN: 1000.0000 mg | Freq: Once | INTRAVENOUS | Status: DC | PRN

## 2020-01-23 MED ORDER — SODIUM CHLORIDE 0.9 % IV SOLN
INTRAVENOUS | Status: DC | PRN
  Administered 2020-01-23: 10 ug/min via INTRAVENOUS

## 2020-01-23 MED ORDER — ONDANSETRON HCL 4 MG/2ML IJ SOLN: 4.0000 mg | Freq: Four times a day (QID) | INTRAMUSCULAR | Status: DC | PRN

## 2020-01-23 MED ORDER — SODIUM CHLORIDE 0.45 % IV SOLN: INTRAVENOUS | Status: DC

## 2020-01-23 MED ORDER — METOCLOPRAMIDE HCL 10 MG PO TABS: 5.0000 mg | ORAL_TABLET | Freq: Three times a day (TID) | ORAL | Status: DC | PRN

## 2020-01-23 MED ORDER — ACETAMINOPHEN 325 MG PO TABS
325.0000 mg | ORAL_TABLET | Freq: Four times a day (QID) | ORAL | Status: DC | PRN
  Administered 2020-01-24: 650 mg via ORAL
  Filled 2020-01-23 (×2): qty 2

## 2020-01-23 MED ORDER — FENTANYL CITRATE (PF) 100 MCG/2ML IJ SOLN: 25.0000 ug | INTRAMUSCULAR | Status: DC | PRN

## 2020-01-23 MED ORDER — MAGNESIUM HYDROXIDE 400 MG/5ML PO SUSP: 30.0000 mL | Freq: Every day | ORAL | Status: DC | PRN

## 2020-01-23 MED ORDER — GABAPENTIN 300 MG PO CAPS
300.0000 mg | ORAL_CAPSULE | Freq: Three times a day (TID) | ORAL | Status: DC
  Administered 2020-01-23 – 2020-01-25 (×6): 300 mg via ORAL
  Filled 2020-01-23 (×6): qty 1

## 2020-01-23 MED ORDER — ONDANSETRON HCL 4 MG/2ML IJ SOLN: 4.0000 mg | Freq: Once | INTRAMUSCULAR | Status: DC | PRN

## 2020-01-23 MED ORDER — PROPOFOL 500 MG/50ML IV EMUL
INTRAVENOUS | Status: AC
  Filled 2020-01-23: qty 50

## 2020-01-23 MED ORDER — NEOMYCIN-POLYMYXIN B GU 40-200000 IR SOLN
Status: DC | PRN
  Administered 2020-01-23: 4 mL

## 2020-01-23 MED ORDER — MIDAZOLAM HCL 2 MG/2ML IJ SOLN
INTRAMUSCULAR | Status: AC
  Filled 2020-01-23: qty 2

## 2020-01-23 MED ORDER — NEOMYCIN-POLYMYXIN B GU 40-200000 IR SOLN
Status: AC
  Filled 2020-01-23: qty 20

## 2020-01-23 MED ORDER — PROPOFOL 500 MG/50ML IV EMUL
INTRAVENOUS | Status: DC | PRN
  Administered 2020-01-23: 50 ug/kg/min via INTRAVENOUS

## 2020-01-23 MED ORDER — CELECOXIB 200 MG PO CAPS
200.0000 mg | ORAL_CAPSULE | Freq: Two times a day (BID) | ORAL | Status: DC
  Administered 2020-01-23 – 2020-01-25 (×4): 200 mg via ORAL
  Filled 2020-01-23 (×4): qty 1

## 2020-01-23 MED ORDER — FENTANYL CITRATE (PF) 100 MCG/2ML IJ SOLN
INTRAMUSCULAR | Status: AC
  Filled 2020-01-23: qty 2

## 2020-01-23 MED ORDER — OXYCODONE HCL 5 MG/5ML PO SOLN: 5.0000 mg | Freq: Once | ORAL | Status: DC | PRN

## 2020-01-23 MED ORDER — METOCLOPRAMIDE HCL 5 MG/ML IJ SOLN: 5.0000 mg | Freq: Three times a day (TID) | INTRAMUSCULAR | Status: DC | PRN

## 2020-01-23 MED ORDER — ONDANSETRON HCL 4 MG/2ML IJ SOLN
INTRAMUSCULAR | Status: DC | PRN
  Administered 2020-01-23: 4 mg via INTRAVENOUS

## 2020-01-23 MED ORDER — ACETAMINOPHEN 10 MG/ML IV SOLN
INTRAVENOUS | Status: AC
  Filled 2020-01-23: qty 100

## 2020-01-23 MED ORDER — ACETAMINOPHEN 10 MG/ML IV SOLN
INTRAVENOUS | Status: DC | PRN
  Administered 2020-01-23: 1000 mg via INTRAVENOUS

## 2020-01-23 MED ORDER — CHLORHEXIDINE GLUCONATE CLOTH 2 % EX PADS
6.0000 | MEDICATED_PAD | Freq: Every day | CUTANEOUS | Status: DC
  Administered 2020-01-23 – 2020-01-25 (×3): 6 via TOPICAL

## 2020-01-23 MED ORDER — ENOXAPARIN SODIUM 40 MG/0.4ML ~~LOC~~ SOLN
30.0000 mg | SUBCUTANEOUS | Status: DC
  Filled 2020-01-23: qty 0.4

## 2020-01-23 MED ORDER — CEFAZOLIN SODIUM-DEXTROSE 2-4 GM/100ML-% IV SOLN
2.0000 g | Freq: Three times a day (TID) | INTRAVENOUS | Status: AC
  Administered 2020-01-23 – 2020-01-24 (×3): 2 g via INTRAVENOUS
  Filled 2020-01-23 (×3): qty 100

## 2020-01-23 MED ORDER — FLEET ENEMA 7-19 GM/118ML RE ENEM: 1.0000 | ENEMA | Freq: Once | RECTAL | Status: DC | PRN

## 2020-01-23 MED ORDER — METHOCARBAMOL 500 MG PO TABS: 500.0000 mg | ORAL_TABLET | Freq: Four times a day (QID) | ORAL | Status: DC | PRN

## 2020-01-23 MED ORDER — PHENYLEPHRINE HCL (PRESSORS) 10 MG/ML IV SOLN
INTRAVENOUS | Status: DC | PRN
  Administered 2020-01-23: 100 ug via INTRAVENOUS

## 2020-01-23 MED ORDER — EPHEDRINE SULFATE 50 MG/ML IJ SOLN
INTRAMUSCULAR | Status: DC | PRN
  Administered 2020-01-23: 10 mg via INTRAVENOUS

## 2020-01-23 MED ORDER — ONDANSETRON HCL 4 MG/2ML IJ SOLN
INTRAMUSCULAR | Status: AC
  Filled 2020-01-23: qty 2

## 2020-01-23 MED ORDER — FENTANYL CITRATE (PF) 100 MCG/2ML IJ SOLN
INTRAMUSCULAR | Status: DC | PRN
  Administered 2020-01-23 (×2): 50 ug via INTRAVENOUS

## 2020-01-23 MED ORDER — CLINDAMYCIN PHOSPHATE 600 MG/50ML IV SOLN
600.0000 mg | Freq: Three times a day (TID) | INTRAVENOUS | Status: AC
  Administered 2020-01-23 – 2020-01-24 (×3): 600 mg via INTRAVENOUS
  Filled 2020-01-23 (×3): qty 50

## 2020-01-23 MED ORDER — HYDROCODONE-ACETAMINOPHEN 5-325 MG PO TABS
1.0000 | ORAL_TABLET | ORAL | Status: DC | PRN
  Administered 2020-01-23 – 2020-01-24 (×4): 2 via ORAL
  Administered 2020-01-24 – 2020-01-25 (×2): 1 via ORAL
  Filled 2020-01-23: qty 1
  Filled 2020-01-23: qty 2
  Filled 2020-01-23 (×2): qty 1
  Filled 2020-01-23: qty 2
  Filled 2020-01-23: qty 1
  Filled 2020-01-23: qty 2

## 2020-01-23 MED ORDER — PHENYLEPHRINE HCL (PRESSORS) 10 MG/ML IV SOLN
INTRAVENOUS | Status: AC
  Filled 2020-01-23: qty 1

## 2020-01-23 MED ORDER — EPHEDRINE SULFATE 50 MG/ML IJ SOLN
INTRAMUSCULAR | Status: AC
  Filled 2020-01-23: qty 1

## 2020-01-23 MED ORDER — MIDAZOLAM HCL 5 MG/5ML IJ SOLN
INTRAMUSCULAR | Status: DC | PRN
  Administered 2020-01-23: 2 mg via INTRAVENOUS

## 2020-01-23 MED ORDER — DOCUSATE SODIUM 100 MG PO CAPS
100.0000 mg | ORAL_CAPSULE | Freq: Two times a day (BID) | ORAL | Status: DC
  Administered 2020-01-23 – 2020-01-25 (×4): 100 mg via ORAL
  Filled 2020-01-23 (×4): qty 1

## 2020-01-23 MED ORDER — MORPHINE SULFATE (PF) 4 MG/ML IV SOLN
0.5000 mg | INTRAVENOUS | Status: DC | PRN
  Administered 2020-01-23 (×2): 1 mg via INTRAVENOUS
  Filled 2020-01-23 (×2): qty 1

## 2020-01-23 MED ORDER — OXYCODONE HCL 5 MG PO TABS: 5.0000 mg | ORAL_TABLET | Freq: Once | ORAL | Status: DC | PRN

## 2020-01-23 SURGICAL SUPPLY — 45 items
BIT DRILL 3.8X6 NS (BIT) ×2 IMPLANT
BIT DRILL 4.4 NS (BIT) ×2 IMPLANT
CANISTER SUCT 1200ML W/VALVE (MISCELLANEOUS) ×3 IMPLANT
CHLORAPREP W/TINT 26 (MISCELLANEOUS) ×3 IMPLANT
COVER WAND RF STERILE (DRAPES) ×3 IMPLANT
CUFF TOURN SGL QUICK 24 (TOURNIQUET CUFF) ×2
CUFF TOURN SGL QUICK 30 (TOURNIQUET CUFF)
CUFF TRNQT CYL 24X4X16.5-23 (TOURNIQUET CUFF) IMPLANT
CUFF TRNQT CYL 30X4X21-28X (TOURNIQUET CUFF) IMPLANT
DRAPE C-ARM XRAY 36X54 (DRAPES) ×3 IMPLANT
DRAPE C-ARMOR (DRAPES) ×2 IMPLANT
DRAPE U-SHAPE 47X51 STRL (DRAPES) ×3 IMPLANT
DRSG AQUACEL AG ADV 3.5X10 (GAUZE/BANDAGES/DRESSINGS) ×3 IMPLANT
ELECT REM PT RETURN 9FT ADLT (ELECTROSURGICAL) ×3
ELECTRODE REM PT RTRN 9FT ADLT (ELECTROSURGICAL) ×1 IMPLANT
GAUZE SPONGE 4X4 12PLY STRL (GAUZE/BANDAGES/DRESSINGS) ×3 IMPLANT
GAUZE XEROFORM 1X8 LF (GAUZE/BANDAGES/DRESSINGS) ×3 IMPLANT
GLOVE INDICATOR 8.0 STRL GRN (GLOVE) ×3 IMPLANT
GLOVE SURG ORTHO 8.5 STRL (GLOVE) ×3 IMPLANT
GLOVE SURG XRAY 8.0 LX (GLOVE) ×1 IMPLANT
GOWN STRL REUS W/ TWL LRG LVL3 (GOWN DISPOSABLE) ×1 IMPLANT
GOWN STRL REUS W/TWL LRG LVL3 (GOWN DISPOSABLE) ×2
GOWN STRL REUS W/TWL LRG LVL4 (GOWN DISPOSABLE) ×3 IMPLANT
GUIDEPIN 3.2X17.5 THRD DISP (PIN) ×2 IMPLANT
GUIDEWIRE BALL NOSE 100CM (WIRE) ×3 IMPLANT
HEMOVAC 400CC 10FR (MISCELLANEOUS) ×3 IMPLANT
KIT TURNOVER KIT A (KITS) ×3 IMPLANT
NAIL TIBIAL 9MMX31.5CM (Nail) ×2 IMPLANT
NDL SPNL 18GX3.5 QUINCKE PK (NEEDLE) ×1 IMPLANT
NEEDLE SPNL 18GX3.5 QUINCKE PK (NEEDLE) ×3 IMPLANT
NS IRRIG 1000ML POUR BTL (IV SOLUTION) ×3 IMPLANT
PACK TOTAL KNEE (MISCELLANEOUS) ×3 IMPLANT
PAD ABD DERMACEA PRESS 5X9 (GAUZE/BANDAGES/DRESSINGS) ×3 IMPLANT
SCREW ACECAP 32MM (Screw) ×2 IMPLANT
SCREW CORTICAL 5.5 35MM (Screw) ×2 IMPLANT
SCREW PROXIMAL DEPUY (Screw) ×2 IMPLANT
SCREW PRXML FT 45X5.5XLCK NS (Screw) IMPLANT
SPONGE LAP 18X18 RF (DISPOSABLE) ×3 IMPLANT
STAPLER SKIN PROX 35W (STAPLE) ×3 IMPLANT
SUT VIC AB 0 CT1 36 (SUTURE) ×6 IMPLANT
SUT VIC AB 2-0 CT1 27 (SUTURE) ×4
SUT VIC AB 2-0 CT1 TAPERPNT 27 (SUTURE) ×2 IMPLANT
SUT VIC AB 3-0 SH 27 (SUTURE) ×2
SUT VIC AB 3-0 SH 27X BRD (SUTURE) IMPLANT
SYR 10ML LL (SYRINGE) ×3 IMPLANT

## 2020-01-23 NOTE — Anesthesia Preprocedure Evaluation (Signed)
Anesthesia Evaluation  Patient identified by MRN, date of birth, ID band Patient awake    Reviewed: Allergy & Precautions, NPO status , Patient's Chart, lab work & pertinent test results  History of Anesthesia Complications Negative for: history of anesthetic complications  Airway Mallampati: II  TM Distance: >3 FB Neck ROM: Full    Dental no notable dental hx. (+) Teeth Intact   Pulmonary neg pulmonary ROS, neg sleep apnea, neg COPD, Patient abstained from smoking.Not current smoker,    Pulmonary exam normal breath sounds clear to auscultation       Cardiovascular Exercise Tolerance: Good METS(-) hypertension(-) CAD and (-) Past MI negative cardio ROS  (-) dysrhythmias  Rhythm:Regular Rate:Normal - Systolic murmurs    Neuro/Psych negative neurological ROS  negative psych ROS   GI/Hepatic neg GERD  ,(+)     (-) substance abuse  ,   Endo/Other  neg diabetes  Renal/GU negative Renal ROS     Musculoskeletal   Abdominal   Peds  Hematology   Anesthesia Other Findings History reviewed. No pertinent past medical history.  Reproductive/Obstetrics                             Anesthesia Physical Anesthesia Plan  ASA: I  Anesthesia Plan: Spinal   Post-op Pain Management:    Induction: Intravenous  PONV Risk Score and Plan: 3 and TIVA, Midazolam, Propofol infusion, Ondansetron and Dexamethasone  Airway Management Planned: Natural Airway and Nasal Cannula  Additional Equipment:   Intra-op Plan:   Post-operative Plan:   Informed Consent: I have reviewed the patients History and Physical, chart, labs and discussed the procedure including the risks, benefits and alternatives for the proposed anesthesia with the patient or authorized representative who has indicated his/her understanding and acceptance.       Plan Discussed with: CRNA and Surgeon  Anesthesia Plan Comments:  (Discussed R/B/A of neuraxial anesthesia technique with patient: - rare risks of spinal/epidural hematoma, nerve damage, infection - Risk of PDPH - Risk of nausea and vomiting - Risk of conversion to general anesthesia and its associated risks  Patient voiced understanding. Will check urine pregnancy test prior to anesthesia.)        Anesthesia Quick Evaluation

## 2020-01-23 NOTE — Transfer of Care (Signed)
Immediate Anesthesia Transfer of Care Note  Patient: Erica Dawson  Procedure(s) Performed: INTRAMEDULLARY (IM) NAIL TIBIAL (Left Leg Lower)  Patient Location: PACU  Anesthesia Type:Spinal  Level of Consciousness: awake, alert  and oriented  Airway & Oxygen Therapy: Patient Spontanous Breathing  Post-op Assessment: Report given to RN and Post -op Vital signs reviewed and stable  Post vital signs: Reviewed and stable  Last Vitals:  Vitals Value Taken Time  BP 134/94 01/23/20 1328  Temp 36.8 C 01/23/20 1305  Pulse 71 01/23/20 1328  Resp 12 01/23/20 1309  SpO2 100 % 01/23/20 1328  Vitals shown include unvalidated device data.  Last Pain:  Vitals:   01/23/20 1305  TempSrc:   PainSc: 0-No pain      Patients Stated Pain Goal: 0 (01/22/20 2202)  Complications: No apparent anesthesia complications

## 2020-01-23 NOTE — Evaluation (Signed)
Physical Therapy Evaluation Patient Details Name: Erica Dawson MRN: [id number] DOB: [birth year] Today's Date: 01/23/2020   History of Present Illness  Pt is a 44 yo female with no pertinent past medical history who slipped and fell on a wet spot on the floor in her home and was diagnosed with a left tibial fracture.  Pt is now s/p IM nail to the L tibia.    Clinical Impression  Interpreter Leola Brazil utilized during the session.  Pt anxious regarding mobility initially but overall performed very well during the session.  Pt required only min A for LLE control in and out of bed and cues for proper sequencing with amb.  After amb 3' and returning to sitting pt reported feeling that amb was "easy" and requested additional ambulation.  Pt then amb 12' with good carryover of proper sequencing and strict adherence to NWB status on the LLE.  Will attempt stair training during a future session but pt is anticipated to perform well.  Pt will benefit from HHPT services upon discharge to safely address deficits listed in patient problem list for decreased caregiver assistance and eventual return to PLOF.      Follow Up Recommendations Home health PT;Supervision for mobility/OOB    Equipment Recommendations  Rolling walker with 5" wheels;3in1 (PT)    Recommendations for Other Services       Precautions / Restrictions Precautions Precautions: Fall Restrictions Weight Bearing Restrictions: Yes LLE Weight Bearing: Non weight bearing      Mobility  Bed Mobility Overal bed mobility: Needs Assistance Bed Mobility: Supine to Sit;Sit to Supine     Supine to sit: Min assist Sit to supine: Min assist   General bed mobility comments: Min A for LLE in and out of bed  Transfers Overall transfer level: Needs assistance Equipment used: Rolling walker (2 wheeled) Transfers: Sit to/from Stand Sit to Stand: From elevated surface;Min guard         General transfer comment: Mod verbal cues for  sequencing  Ambulation/Gait   Gait Distance (Feet): 12 Feet x 1, 3 Feet x 1 Assistive device: Rolling walker (2 wheeled)   Gait velocity: decreased   General Gait Details: Hop-to gait pattern with good stability and mod verbal cues for proper sequencing with goood carry over  Stairs            Wheelchair Mobility    Modified Rankin (Stroke Patients Only)       Balance Overall balance assessment: Needs assistance   Sitting balance-Leahy Scale: Normal     Standing balance support: Bilateral upper extremity supported Standing balance-Leahy Scale: Good                               Pertinent Vitals/Pain Pain Assessment: 0-10 Pain Score: 9  Pain Location: LLE Pain Descriptors / Indicators: Aching;Sore Pain Intervention(s): Premedicated before session;Monitored during session    [place] expects to be discharged to:: Private residence Living Arrangements: Children Available Help at Discharge: Family;Available 24 hours/day Type of Home: House Home Access: Stairs to enter Entrance Stairs-Rails: None Entrance Stairs-Number of Steps: 3 Home Layout: Two level;Able to live on main level with bedroom/bathroom Home Equipment: None Additional Comments: Pt lives at home with adult son and dtr who are available 24/7    Prior Function Level of Independence: Independent         Comments: Ind amb community distances without an AD, no other fall history, slipped  and fell on wet floor resulting in current admission, Ind with ADLs     Hand Dominance        Extremity/Trunk Assessment   Upper Extremity Assessment Upper Extremity Assessment: Overall WFL for tasks assessed    Lower Extremity Assessment Lower Extremity Assessment: Generalized weakness;LLE deficits/detail LLE Deficits / Details: LLE sensation to light touch intact and able to wiggle toes in splint LLE: Unable to fully assess due to pain LLE Sensation: WNL        Communication   Communication: Interpreter utilized  Cognition Arousal/Alertness: Awake/alert Behavior During Therapy: WFL for tasks assessed/performed Overall Cognitive Status: Within Functional Limits for tasks assessed                                        General Comments      Exercises Total Joint Exercises Ankle Circles/Pumps: AROM;Strengthening;Right;10 reps(toe wiggles on the LLE) Quad Sets: Strengthening;Both;10 reps;5 reps Gluteal Sets: Strengthening;Both;10 reps Hip ABduction/ADduction: AAROM;Left;5 reps Straight Leg Raises: AAROM;Left;5 reps Long Arc Quad: AROM;10 reps;5 reps;Both Knee Flexion: AROM;Both;5 reps;10 reps Other Exercises Other Exercises: HEP education for RLE APs/LLE toe wiggles, BLE QS, and GS x 10 each every 1-2 hours daily   Assessment/Plan    PT Assessment Patient needs continued PT services  PT Problem List Decreased strength;Decreased range of motion;Decreased activity tolerance;Decreased balance;Decreased mobility;Decreased knowledge of use of DME;Decreased safety awareness;Decreased knowledge of precautions;Pain       PT Treatment Interventions DME instruction;Gait training;Stair training;Functional mobility training;Therapeutic activities;Therapeutic exercise;Balance training;Patient/family education    PT Goals (Current goals can be found in the Care Plan section)  Acute Rehab PT Goals Patient Stated Goal: To walk better PT Goal Formulation: With patient Time For Goal Achievement: 02/05/20 Potential to Achieve Goals: Good    Frequency BID   Barriers to discharge        Co-evaluation               AM-PAC PT "6 Clicks" Mobility  Outcome Measure Help needed turning from your back to your side while in a flat bed without using bedrails?: A Little Help needed moving from lying on your back to sitting on the side of a flat bed without using bedrails?: A Little Help needed moving to and from a bed to a chair  (including a wheelchair)?: A Little Help needed standing up from a chair using your arms (e.g., wheelchair or bedside chair)?: A Little Help needed to walk in hospital room?: A Little Help needed climbing 3-5 steps with a railing? : A Lot 6 Click Score: 17    End of Session Equipment Utilized During Treatment: Gait belt Activity Tolerance: Patient tolerated treatment well;No increased pain Patient left: in bed;with family/visitor present;with call bell/phone within reach;with bed alarm set;with SCD's reapplied Nurse Communication: Mobility status;Weight bearing status PT Visit Diagnosis: Other abnormalities of gait and mobility (R26.89);Muscle weakness (generalized) (M62.81);Pain Pain - Right/Left: Left Pain - part of body: Leg    Time: 5784-6962 PT Time Calculation (min) (ACUTE ONLY): 47 min   Charges:   PT Evaluation $PT Eval Moderate Complexity: 1 Mod PT Treatments $Therapeutic Exercise: 8-22 mins       D. Elly Modena PT, DPT 01/23/20, 5:15 PM

## 2020-01-23 NOTE — Plan of Care (Signed)

## 2020-01-23 NOTE — Op Note (Signed)
Expand All Collapse All     01/23/2020  12:19 PM  PATIENT:  Erica Dawson   MRN: [id number]  PRE-OPERATIVE DIAGNOSIS:  Left tibal fracture, short oblique, junction of middle and distal thirds  POST-OPERATIVE DIAGNOSIS: Same  PROCEDURE:  Procedure(s): INTRAMEDULLARY (IM) NAIL TIBIAL LEFT  SURGEON:  Valinda Hoar, MD  ANESTHESIA:   Spinal   PREOPERATIVE INDICATIONS:  The patient  has a diagnosis of displaced and unstable tibia/ fibula fractures who elected for surgical management after discussion with the patient   about the options between surgery and cast management of the fractures. .  The risks benefits and alternatives were discussed with the patient preoperatively including but not limited to the risks of infection, bleeding, nerve injury, cardiopulmonary complications, the need for revision surgery, among others, and the patient was willing to proceed.  OPERATIVE IMPLANTS: Biomet Versanail,   31.5 mm    9 mm  OPERATIVE FINDINGS: Short oblique fracture tibial shaft with proximal fibular fracture  COMPLICATIONS: None  EBL: 50 cc      REPLACED: None  OPERATIVE PROCEDURE: The patient was brought to the operating room and underwent spinal anesthesia without complications and then placed on the operating room table and positioned appropriately.  The operative leg was prepped and draped in a sterile fashion.  Tourniquet was not used.  IV anti-biotics were given.  The leg was placed on the tibial reduction triangle and the foot immobilized with Coban and traction and rotational corrections were made.  A proximal medial incision was made along the patellar tendon.  Dissection was carried out bluntly through subcutaneous tissue and the fascia was divided.  A guidepin was introduced into the proximal tibia under fluoroscopic control and seen to be in good alignment and position.  Large drill was introduced to open the the proximal tibia.  A guidepin was then passed down the  shaft of the tibia and across the fracture site down to the lower end of the tibia.  The shaft was then sequentially reamed to 10.5 mm.  The above listed Versanail was introduced and passed across the fracture site and fully seated in the tibia.  Fluoroscopy showed excellent position of the nail and that the fracture had been well reduced.  Length was excellent.  Proximally, fixation was obtained with 2 5.5 screw(s).   Fluoroscopy showed good position and length.  Distally, 1 medial screw was introduced and seated fully. Fluoroscopy showed good position and length.  The wounds were then irrigated.  The knee fascia was closed with 2-0 Vicryls and the subcutaneous tissue was closed with 3-0 Vicryls.  All wounds were closed with staples.  Xeroform covered by dressing sponges and cast padding were applied. Sponge and needle counts were correct.   A well-padded posterior splint was applied.  The patient was transferred to the hospital bed and taken to recovery room in good condition.  Valinda Hoar, MD

## 2020-01-23 NOTE — Plan of Care (Signed)
  Problem: Education: Goal: Knowledge of General Education information will improve Description Including pain rating scale, medication(s)/side effects and non-pharmacologic comfort measures Outcome: Progressing   Problem: Activity: Goal: Risk for activity intolerance will decrease Outcome: Progressing   Problem: Coping: Goal: Level of anxiety will decrease Outcome: Progressing   Problem: Elimination: Goal: Will not experience complications related to bowel motility Outcome: Progressing   Problem: Pain Managment: Goal: General experience of comfort will improve Outcome: Progressing   

## 2020-01-23 NOTE — Anesthesia Procedure Notes (Signed)
Spinal  Patient location during procedure: OR Start time: 01/23/2020 9:54 AM End time: 01/23/2020 9:58 AM Staffing Performed: resident/CRNA  Anesthesiologist: Corinda Gubler, MD Resident/CRNA: Elmarie Mainland, CRNA Preanesthetic Checklist Completed: patient identified, IV checked, site marked, risks and benefits discussed, surgical consent, monitors and equipment checked, pre-op evaluation and timeout performed Spinal Block Patient position: sitting Prep: ChloraPrep Patient monitoring: continuous pulse ox and blood pressure Approach: midline Location: L3-4 Injection technique: single-shot Needle Needle type: Pencil-Tip  Needle gauge: 24 G Additional Notes Neg parestehesia, neg heme.

## 2020-01-23 NOTE — H&P (Signed)
THE PATIENT WAS SEEN PRIOR TO SURGERY TODAY.  HISTORY, ALLERGIES, HOME MEDICATIONS AND OPERATIVE PROCEDURE WERE REVIEWED. RISKS AND BENEFITS OF SURGERY DISCUSSED WITH PATIENT AGAIN.  NO CHANGES FROM INITIAL HISTORY AND PHYSICAL NOTED.   AN INTERPRETER WAS USED TO GO OVER THESE THINGS IN DETAIL.

## 2020-01-23 NOTE — Progress Notes (Signed)
Urine Pregnancy discontinued by Dr Suzan Slick. Patient reports having a tubal ligation after her 3rd child.

## 2020-01-24 LAB — BASIC METABOLIC PANEL
Anion gap: 5 (ref 5–15)
BUN: 8 mg/dL (ref 6–20)
CO2: 26 mmol/L (ref 22–32)
Calcium: 8.2 mg/dL — ABNORMAL LOW (ref 8.9–10.3)
Chloride: 106 mmol/L (ref 98–111)
Creatinine, Ser: 0.67 mg/dL (ref 0.44–1.00)
GFR calc Af Amer: >60 mL/min (ref >60)
GFR calc non Af Amer: >60 mL/min (ref >60)
Glucose, Bld: 94 mg/dL (ref 70–99)
Potassium: 3.5 mmol/L (ref 3.5–5.1)
Sodium: 137 mmol/L (ref 135–145)

## 2020-01-24 MED ORDER — ENOXAPARIN SODIUM 30 MG/0.3ML ~~LOC~~ SOLN
30.0000 mg | SUBCUTANEOUS | Status: DC
  Administered 2020-01-24 – 2020-01-25 (×2): 30 mg via SUBCUTANEOUS
  Filled 2020-01-24 (×2): qty 0.3

## 2020-01-24 NOTE — Progress Notes (Signed)
Subjective: 1 Day Post-Op Procedure(s) (LRB): INTRAMEDULLARY (IM) NAIL TIBIAL (Left)   Doing much better today she says.  Pain is well controlled.  Walked on a walker with PT.  Wishes to go home tomorrow.  Patient reports pain as moderate.  Objective:   VITALS:   Vitals:   01/24/20 1300 01/24/20 1549  BP: 120/70 112/78  Pulse: 78 84  Resp: 16 17  Temp:  98 F (36.7 C)  SpO2: 98% 100%    Neurologically intact ABD soft Neurovascular intact Sensation intact distally Intact pulses distally Dorsiflexion/Plantar flexion intact Incision: no drainage  LABS Recent Labs    01/22/20 1417 01/23/20 1354  HGB 13.3 13.2  HCT 41.2 41.7  WBC 9.0 10.5  PLT 247 222    Recent Labs    01/22/20 1417 01/23/20 1354 01/24/20 0419  NA 137  --  137  K 4.0  --  3.5  BUN 10  --  8  CREATININE 0.59 0.60 0.67  GLUCOSE 105*  --  94    Recent Labs    01/22/20 1721  INR 1.0     Assessment/Plan: 1 Day Post-Op Procedure(s) (LRB): INTRAMEDULLARY (IM) NAIL TIBIAL (Left)   Advance diet Up with therapy Discharge home with home health

## 2020-01-24 NOTE — Progress Notes (Signed)
Physical Therapy Treatment Patient Details Name: Erica Dawson MRN: [id number] DOB: 1976-07-09 Today's Date: 01/24/2020    History of Present Illness Pt is a 44 yo female with no pertinent past medical history who slipped and fell on a wet spot on the floor in her home and was diagnosed with a left tibial fracture.  Pt is now s/p IM nail to the L tibia.    PT Comments    Spanish interpreted Erica Dawson utilized throughout the session.  Pt pleasant and motivated throughout the session with pt's boyfriend present for stair training.  Pt was able to get her LLE out of bed for the first time without assistance this session and performed below therex with decreased c/o pain.  Pt was steady with amb with LLE NWB compliance maintained throughout.  Pt and boyfriend participated in stair training with pt demonstrating good carryover regarding sequencing from the prior session. Overall pt steady ascending and descending stairs and stated that she felt comfortable and confident regarding getting in and out of her home.  Pt will benefit from HHPT services upon discharge to safely address deficits listed in patient problem list for decreased caregiver assistance and eventual return to PLOF.   Follow Up Recommendations  Home health PT;Supervision for mobility/OOB     Equipment Recommendations  Rolling walker with 5" wheels;3in1 (PT)    Recommendations for Other Services       Precautions / Restrictions Precautions Precautions: Fall Restrictions Weight Bearing Restrictions: Yes LLE Weight Bearing: Non weight bearing    Mobility  Bed Mobility Overal bed mobility: Needs Assistance Bed Mobility: Supine to Sit;Sit to Supine     Supine to sit: Supervision Sit to supine: Min assist   General bed mobility comments: Min A for LLE into bed but pt able to get her LLE out of bed with extra time and effort but without the need of physical assistance  Transfers Overall transfer level: Needs  assistance Equipment used: Rolling walker (2 wheeled) Transfers: Sit to/from Stand Sit to Stand: From elevated surface;Supervision         General transfer comment: Min verbal cues for sequencing most notably for hand placement  Ambulation/Gait Ambulation/Gait assistance: Min guard Gait Distance (Feet): 20 Feet x 2 Assistive device: Rolling walker (2 wheeled)   Gait velocity: decreased   General Gait Details: Hop-to gait pattern with good stability and min verbal cues for proper sequencing with goood carry over   Stairs Stairs: Yes Stairs assistance: Min guard;+2 safety/equipment Stair Management: No rails;With walker;Backwards;Forwards Number of Stairs: 3 General stair comments: Visual demonstration for proper sequencing followed by pt practice with boyfriend present for training; pt steady without LOB ascending and descending stairs   Wheelchair Mobility    Modified Rankin (Stroke Patients Only)       Balance Overall balance assessment: Needs assistance   Sitting balance-Leahy Scale: Normal     Standing balance support: Bilateral upper extremity supported Standing balance-Leahy Scale: Good                              Cognition Arousal/Alertness: Awake/alert Behavior During Therapy: WFL for tasks assessed/performed Overall Cognitive Status: Within Functional Limits for tasks assessed                                        Exercises Total Joint Exercises Ankle Circles/Pumps: AROM;Strengthening;Right;10  reps;Other (comment);15 reps(LLE toe wiggles) Quad Sets: Strengthening;Both;10 reps;5 reps Gluteal Sets: Strengthening;Both;10 reps Hip ABduction/ADduction: AAROM;Left;10 reps Straight Leg Raises: AAROM;Left;10 reps Long Arc Quad: AROM;10 reps;Both;15 reps Knee Flexion: AROM;Both;10 reps;15 reps Other Exercises Other Exercises: HEP education/review for RLE APs/LLE toe wiggles, BLE QS, LAQs, and GS x 10 each every 1-2 hours  daily Other Exercises: Sit to/from stand transfer training from various height surfaces    General Comments        Pertinent Vitals/Pain Pain Assessment: 0-10 Pain Score: 2  Pain Location: LLE Pain Descriptors / Indicators: Aching;Sore Pain Intervention(s): Monitored during session;Premedicated before session    Home Living                      Prior Function            PT Goals (current goals can now be found in the care plan section) Progress towards PT goals: Progressing toward goals    Frequency    BID      PT Plan Current plan remains appropriate    Co-evaluation              AM-PAC PT "6 Clicks" Mobility   Outcome Measure  Help needed turning from your back to your side while in a flat bed without using bedrails?: A Little Help needed moving from lying on your back to sitting on the side of a flat bed without using bedrails?: A Little Help needed moving to and from a bed to a chair (including a wheelchair)?: A Little Help needed standing up from a chair using your arms (e.g., wheelchair or bedside chair)?: A Little Help needed to walk in hospital room?: A Little Help needed climbing 3-5 steps with a railing? : A Little 6 Click Score: 18    End of Session Equipment Utilized During Treatment: Gait belt Activity Tolerance: Patient tolerated treatment well;No increased pain Patient left: in chair;with family/visitor present;with call bell/phone within reach Nurse Communication: Mobility status PT Visit Diagnosis: Other abnormalities of gait and mobility (R26.89);Muscle weakness (generalized) (M62.81);Pain Pain - Right/Left: Left Pain - part of body: Leg     Time: 1350-1430 PT Time Calculation (min) (ACUTE ONLY): 40 min  Charges:  $Gait Training: 8-22 mins $Therapeutic Exercise: 8-22 mins $Therapeutic Activity: 8-22 mins                     D. Scott Erica Dawson PT, DPT 01/24/20, 3:03 PM

## 2020-01-24 NOTE — Anesthesia Postprocedure Evaluation (Signed)
Anesthesia Post Note  Patient: Erica Dawson  Procedure(s) Performed: INTRAMEDULLARY (IM) NAIL TIBIAL (Left Leg Lower)  Patient location during evaluation: PACU Anesthesia Type: Spinal Level of consciousness: oriented and awake and alert Pain management: pain level controlled Vital Signs Assessment: post-procedure vital signs reviewed and stable Respiratory status: spontaneous breathing, respiratory function stable and patient connected to nasal cannula oxygen Cardiovascular status: blood pressure returned to baseline and stable Postop Assessment: no headache, no backache, no apparent nausea or vomiting, patient able to bend at knees and spinal receding Anesthetic complications: no     Last Vitals:  Vitals:   01/23/20 2309 01/24/20 0343  BP:  121/73  Pulse:  73  Resp: 18 16  Temp: 37.1 C 36.8 C  SpO2:  100%    Last Pain:  Vitals:   01/24/20 0634  TempSrc:   PainSc: 6                  Corinda Gubler

## 2020-01-24 NOTE — Progress Notes (Signed)
Physical Therapy Treatment Patient Details Name: Erica Dawson MRN: [id number] DOB: [birth year] Today's Date: 01/24/2020    History of Present Illness Pt is a 44 yo female with no pertinent past medical history who slipped and fell on a wet spot on the floor in her home and was diagnosed with a left tibial fracture.  Pt is now s/p IM nail to the L tibia.    PT Comments    Spanish interpreted Herb Grays utilized throughout the session.  Pt pleasant and motivated to participate and reported feeling less LLE pain this date.  Pt steady with transfers and gait with only min cues for proper sequencing.  Pt somewhat anxious regarding stair training but was steady ascending and descending steps with good control and reported feeling that "it was easier than I thought it would be".  Will attempt to practice stairs in the PM with pt's boyfriend.  Pt will benefit from HHPT services upon discharge to safely address deficits listed in patient problem list for decreased caregiver assistance and eventual return to PLOF.    Follow Up Recommendations  Home health PT;Supervision for mobility/OOB     Equipment Recommendations  Rolling walker with 5" wheels;3in1 (PT)    Recommendations for Other Services       Precautions / Restrictions Precautions Precautions: Fall Restrictions Weight Bearing Restrictions: Yes LLE Weight Bearing: Non weight bearing    Mobility  Bed Mobility Overal bed mobility: Needs Assistance Bed Mobility: Supine to Sit;Sit to Supine     Supine to sit: Min assist Sit to supine: Min assist   General bed mobility comments: Min A for LLE in and out of bed  Transfers Overall transfer level: Needs assistance Equipment used: Rolling walker (2 wheeled) Transfers: Sit to/from Stand Sit to Stand: From elevated surface;Min guard         General transfer comment: Min verbal cues for sequencing most notably for hand placement  Ambulation/Gait Ambulation/Gait assistance:  Min guard Gait Distance (Feet): 15 Feet x 2 Assistive device: Rolling walker (2 wheeled)   Gait velocity: decreased   General Gait Details: Hop-to gait pattern with good stability and min verbal cues for proper sequencing with goood carry over   Stairs Stairs: Yes Stairs assistance: Min guard;+2 safety/equipment Stair Management: No rails;With walker;Backwards;Forwards Number of Stairs: 3 General stair comments: Visual demonstration for proper sequencing followed by pt practice; pt minimally anxious but was steady without LOB ascending and descending stairs   Wheelchair Mobility    Modified Rankin (Stroke Patients Only)       Balance Overall balance assessment: Needs assistance   Sitting balance-Leahy Scale: Normal     Standing balance support: Bilateral upper extremity supported Standing balance-Leahy Scale: Good                              Cognition Arousal/Alertness: Awake/alert Behavior During Therapy: WFL for tasks assessed/performed Overall Cognitive Status: Within Functional Limits for tasks assessed                                        Exercises Total Joint Exercises Hip ABduction/ADduction: AAROM;Left;5 reps Straight Leg Raises: AAROM;Left;5 reps    General Comments        Pertinent Vitals/Pain Pain Assessment: 0-10 Pain Score: 2  Pain Location: LLE Pain Descriptors / Indicators: Aching;Sore Pain Intervention(s): Premedicated before session;Monitored during session  Home Living                      Prior Function            PT Goals (current goals can now be found in the care plan section) Progress towards PT goals: Progressing toward goals    Frequency    BID      PT Plan Current plan remains appropriate    Co-evaluation              AM-PAC PT "6 Clicks" Mobility   Outcome Measure  Help needed turning from your back to your side while in a flat bed without using bedrails?: A  Little Help needed moving from lying on your back to sitting on the side of a flat bed without using bedrails?: A Little Help needed moving to and from a bed to a chair (including a wheelchair)?: A Little Help needed standing up from a chair using your arms (e.g., wheelchair or bedside chair)?: A Little Help needed to walk in hospital room?: A Little Help needed climbing 3-5 steps with a railing? : A Little 6 Click Score: 18    End of Session Equipment Utilized During Treatment: Gait belt Activity Tolerance: Patient tolerated treatment well;No increased pain Patient left: in bed;with call bell/phone within reach;with bed alarm set;with SCD's reapplied(Pt declined up in chair) Nurse Communication: Mobility status PT Visit Diagnosis: Other abnormalities of gait and mobility (R26.89);Muscle weakness (generalized) (M62.81);Pain Pain - Right/Left: Left Pain - part of body: Leg     Time: 5573-2202 PT Time Calculation (min) (ACUTE ONLY): 29 min  Charges:  $Gait Training: 23-37 mins                     D. Scott Jacalyn Biggs PT, DPT 01/24/20, 10:12 AM

## 2020-01-25 LAB — BASIC METABOLIC PANEL
Anion gap: 3 — ABNORMAL LOW (ref 5–15)
BUN: 8 mg/dL (ref 6–20)
CO2: 28 mmol/L (ref 22–32)
Calcium: 8.2 mg/dL — ABNORMAL LOW (ref 8.9–10.3)
Chloride: 108 mmol/L (ref 98–111)
Creatinine, Ser: 0.61 mg/dL (ref 0.44–1.00)
GFR calc Af Amer: >60 mL/min (ref >60)
GFR calc non Af Amer: >60 mL/min (ref >60)
Glucose, Bld: 96 mg/dL (ref 70–99)
Potassium: 3.5 mmol/L (ref 3.5–5.1)
Sodium: 139 mmol/L (ref 135–145)

## 2020-01-25 MED ORDER — HYDROCODONE-ACETAMINOPHEN 5-325 MG PO TABS: 1.0000 | ORAL_TABLET | Freq: Four times a day (QID) | ORAL | 0 refills | Status: AC | PRN

## 2020-01-25 MED ORDER — GABAPENTIN 400 MG PO CAPS: 400.0000 mg | ORAL_CAPSULE | Freq: Three times a day (TID) | ORAL | 3 refills | Status: AC

## 2020-01-25 NOTE — Discharge Summary (Signed)
Physician Discharge Summary  Patient ID: Erica Dawson MRN: [id number] DOB/AGE: Apr 30, 1976 44 y.o.  Admit date: 01/22/2020 Discharge date: 01/25/2020  Admission Diagnoses: Displaced left tibia and fibular shaft fractures  Discharge Diagnoses:  Active Problems:   Closed left tibial fracture   Discharged Condition: good  Hospital Course: Patient suffered a severe fracture of the left tibia and fibula.  This was stabilized in the emergency room by closed reduction and splinting.  With surgery the next day with the intramedullary rodding without complication.  She did very well with minimal pain.  She responded well to physical therapy and is ready for home discharge.  She will elevate the leg at home and return to my office in 5 days for exam and x-ray.  She will take enteric-coated aspirin 3 and 25 mg twice daily.  Consults: None  Significant Diagnostic Studies: radiology: X-Ray: Satisfactory IM nailing of left tibia  Treatments: surgery: Intramedullary rodding ORIF of the left tibia  Discharge Exam: Blood pressure 117/74, pulse 74, temperature 98.4 F (36.9 C), temperature source Oral, resp. rate 17, height 5\' 6"  (1.676 m), weight 70.3 kg, last menstrual period 01/03/2020, SpO2 98 %. Patient is doing well.  The dressings are dry.  Neurovascular status good distally.  She moves the knee and hip well.  Disposition: Discharge disposition: 01-Home or Self Care       Discharge Instructions    Call MD for:  persistant nausea and vomiting   Complete by: As directed    Call MD for:  redness, tenderness, or signs of infection (pain, swelling, redness, odor or green/yellow discharge around incision site)   Complete by: As directed    Call MD for:  severe uncontrolled pain   Complete by: As directed    Call MD for:  temperature >100.4   Complete by: As directed    Diet general   Complete by: As directed    Increase activity slowly   Complete by: As directed    Leave  dressing on - Keep it clean, dry, and intact until clinic visit   Complete by: As directed      Allergies as of 01/25/2020   No Known Allergies     Medication List    TAKE these medications   gabapentin 400 MG capsule Commonly known as: Neurontin Take 1 capsule (400 mg total) by mouth 3 (three) times daily.   HYDROcodone-acetaminophen 5-325 MG tablet Commonly known as: Norco Take 1-2 tablets by mouth every 6 (six) hours as needed.            Durable Medical Equipment  (From admission, onward)         Start     Ordered   01/23/20 1315  DME Walker rolling  Once    Question Answer Comment  Walker: With 5 Inch Wheels   Patient needs a walker to treat with the following condition Closed left tibial fracture      01/23/20 1314           Signed: 01/25/20 01/25/2020, 1:56 PM

## 2020-01-25 NOTE — Progress Notes (Signed)
Physical Therapy Treatment Patient Details Name: Erica Dawson MRN: [id number] DOB: 08/27/1976 Today's Date: 01/25/2020    History of Present Illness Pt is a 44 yo female with no pertinent past medical history who slipped and fell on a wet spot on the floor in her home and was diagnosed with a left tibial fracture.  Pt is now s/p IM nail to the L tibia.    PT Comments    Pt pleasant and motivated to participate throughout the session and continued to make good progress towards goals.  Pt was able to get to sitting from supine with no physical assistance and was steady with transfers with good sequencing.  Pt was able to increase her amb distance to 39' this session with good stability and with LLE NWB compliance throughout.  Pt will benefit from HHPT services upon discharge to safely address deficits listed in patient problem list for decreased caregiver assistance and eventual return to PLOF.    Follow Up Recommendations  Home health PT;Supervision for mobility/OOB     Equipment Recommendations  Rolling walker with 5" wheels;3in1 (PT)    Recommendations for Other Services       Precautions / Restrictions Precautions Precautions: Fall Restrictions Weight Bearing Restrictions: Yes LLE Weight Bearing: Non weight bearing    Mobility  Bed Mobility Overal bed mobility: Needs Assistance Bed Mobility: Supine to Sit;Sit to Supine     Supine to sit: Supervision     General bed mobility comments: Pt assisted LLE out of bed with her BUEs  Transfers Overall transfer level: Needs assistance Equipment used: Rolling walker (2 wheeled) Transfers: Sit to/from Stand Sit to Stand: From elevated surface;Supervision         General transfer comment: Min verbal cues for sequencing most notably for hand placement  Ambulation/Gait Ambulation/Gait assistance: Min guard Gait Distance (Feet): 80 Feet Assistive device: Rolling walker (2 wheeled)   Gait velocity: decreased    General Gait Details: Hop-to gait pattern with good stability and sequencing   Stairs             Wheelchair Mobility    Modified Rankin (Stroke Patients Only)       Balance Overall balance assessment: Needs assistance   Sitting balance-Leahy Scale: Normal     Standing balance support: Bilateral upper extremity supported Standing balance-Leahy Scale: Good                              Cognition Arousal/Alertness: Awake/alert Behavior During Therapy: WFL for tasks assessed/performed Overall Cognitive Status: Within Functional Limits for tasks assessed                                        Exercises Total Joint Exercises Ankle Circles/Pumps: AROM;Strengthening;Right;10 reps;Other (comment);15 reps(Toe wiggles on the LLE) Quad Sets: Strengthening;Both;10 reps;5 reps Gluteal Sets: Strengthening;Both;10 reps Hip ABduction/ADduction: AAROM;10 reps;15 reps;Both;AROM(AAROM on the LLE) Straight Leg Raises: AAROM;10 reps;Both;15 reps;AROM(AAROM on the LLE) Long Arc Quad: AROM;10 reps;Both;15 reps Knee Flexion: AROM;Both;10 reps;15 reps Other Exercises Other Exercises: HEP education/review for RLE APs/LLE toe wiggles, BLE QS, LAQs, and GS x 10 each every 1-2 hours daily Other Exercises: Sit to/from stand transfer training from various height surfaces Other Exercises: Car transfer sequencing education Other Exercises: BSC set up/use education    General Comments        Pertinent Vitals/Pain Pain  Assessment: 0-10 Pain Score: 5  Pain Location: LLE Pain Descriptors / Indicators: Aching;Sore Pain Intervention(s): Premedicated before session;Monitored during session    Home Living                      Prior Function            PT Goals (current goals can now be found in the care plan section) Progress towards PT goals: Progressing toward goals    Frequency    BID      PT Plan Current plan remains appropriate     Co-evaluation              AM-PAC PT "6 Clicks" Mobility   Outcome Measure  Help needed turning from your back to your side while in a flat bed without using bedrails?: A Little Help needed moving from lying on your back to sitting on the side of a flat bed without using bedrails?: A Little Help needed moving to and from a bed to a chair (including a wheelchair)?: A Little Help needed standing up from a chair using your arms (e.g., wheelchair or bedside chair)?: A Little Help needed to walk in hospital room?: A Little Help needed climbing 3-5 steps with a railing? : A Little 6 Click Score: 18    End of Session Equipment Utilized During Treatment: Gait belt Activity Tolerance: Patient tolerated treatment well;No increased pain Patient left: in chair;with call bell/phone within reach Nurse Communication: Mobility status PT Visit Diagnosis: Other abnormalities of gait and mobility (R26.89);Muscle weakness (generalized) (M62.81);Pain Pain - Right/Left: Left Pain - part of body: Leg     Time: 3419-6222 PT Time Calculation (min) (ACUTE ONLY): 38 min  Charges:  $Gait Training: 8-22 mins $Therapeutic Exercise: 8-22 mins $Therapeutic Activity: 8-22 mins                     D. Scott Darian Ace PT, DPT 01/25/20, 1:30 PM

## 2020-01-25 NOTE — Progress Notes (Signed)
Subjective: 2 Days Post-Op Procedure(s) (LRB): INTRAMEDULLARY (IM) NAIL TIBIAL (Left)   Patient looks much more comfortable today and wants to go home.  She is progress with PT with a walker.  She will keep the leg elevated at home and take aspirin twice a day.  She will return to the office in 5 days for exam and x-rays.  Patient reports pain as mild.  Objective:   VITALS:   Vitals:   01/25/20 0007 01/25/20 0806  BP: 103/67 117/74  Pulse: 79 74  Resp: 18 17  Temp: 98 F (36.7 C) 98.4 F (36.9 C)  SpO2: 99% 98%    Neurologically intact ABD soft Neurovascular intact Sensation intact distally Intact pulses distally Dorsiflexion/Plantar flexion intact Incision: no drainage  LABS Recent Labs    01/22/20 1417 01/23/20 1354  HGB 13.3 13.2  HCT 41.2 41.7  WBC 9.0 10.5  PLT 247 222    Recent Labs    01/22/20 1417 01/22/20 1417 01/23/20 1354 01/24/20 0419 01/25/20 0438  NA 137  --   --  137 139  K 4.0  --   --  3.5 3.5  BUN 10  --   --  8 8  CREATININE 0.59   < > 0.60 0.67 0.61  GLUCOSE 105*  --   --  94 96   < > = values in this interval not displayed.    Recent Labs    01/22/20 1721  INR 1.0     Assessment/Plan: 2 Days Post-Op Procedure(s) (LRB): INTRAMEDULLARY (IM) NAIL TIBIAL (Left)   Up with therapy Discharge home with home health   Enteric-coated aspirin 325 mg twice daily on discharge  Return to clinic 5 days for exam and x-ray

## 2020-01-25 NOTE — TOC Transition Note (Signed)
Transition of Care [facility]) - CM/SW Discharge Note   Patient Details  Name: Erica Dawson MRN: [id number] Date of Birth: [birth year]  Transition of Care [facility]) CM/SW Contact:  Barrie Dunker, RN Phone Number: 01/25/2020, 2:08 PM   Clinical Narrative:     Patient to DC home with [facility] for [facility] services and a RW and BSC from [employer].  No additional needs  Final next level of care: Home w Home Health Services Barriers to Discharge: Barriers Resolved   Patient Goals and CMS Choice Patient states their goals for this hospitalization and ongoing recovery are:: Go home      Discharge Placement                       Discharge Plan and Services   Discharge Planning Services: NA            DME Arranged: 3-N-1, Walker rolling DME Agency: [employer] Date DME Agency Contacted: 01/25/20 Time DME Agency Contacted: [phone] Representative spoke with at DME Agency: Nida Boatman HH Arranged: PT HH Agency: [facility] Date [facility] Agency Contacted: 01/25/20 Time HH Agency Contacted: [phone] Representative spoke with at [facility] Agency: Kandee Keen  Social Determinants of Health (SDOH) Interventions     Readmission Risk Interventions No flowsheet data found.

## 2020-01-25 NOTE — TOC Initial Note (Signed)
Transition of Care [facility]) - Initial/Assessment Note    Patient Details  Name: Lindsee Labarre MRN: [id number] Date of Birth: 12/10/1976  Transition of [facility]) CM/SW Contact:    Su Hilt, RN Phone Number: 01/25/2020, 9:44 AM  Clinical Narrative:                 Met with the patient at the bedside along with [place] interpreter, The patient has no insurance and needs a RW and 3 in 1, I notified Brad with [employer] to provide one thru the charity program, it will be brought to the patient's room before DC, I notified Tommi Rumps with [facility] that the patient needs [facility] services for PT thru the charity program, He accepted the patient.  There are no additional needs at this time, CM to follow the patient to DC for any additional needs  Expected Discharge Plan: [place] Barriers to Discharge: Continued Medical Work up   Patient Goals and CMS Choice Patient states their goals for this hospitalization and ongoing recovery are:: Go home      Expected Discharge Plan and Services Expected Discharge Plan: [place]   Discharge Planning Services: NA   Living arrangements for the past 2 months: Single Family Home                 DME Arranged: 3-N-1, Walker rolling DME Agency: [employer] Date DME Agency Contacted: 01/25/20 Time DME Agency Contacted: [phone] Representative spoke with at DME Agency: [place] Arranged: PT [place] [place] Date [place]: 01/25/20 Time [place]: [phone] Representative spoke with at [place]  Prior Living Arrangements/Services Living arrangements for the past 2 months: [place] with:: Spouse Patient language and need for interpreter reviewed:: Yes(Interpreter used) Do you feel safe going back to the place where you live?: Yes      Need for Family Participation in Patient Care: No (Comment) Care giver support system in place?: Yes (comment)   Criminal  Activity/Legal Involvement Pertinent to Current Situation/Hospitalization: No - Comment as needed  Activities of Daily Living Home Assistive Devices/Equipment: None ADL Screening (condition at time of admission) Patient's cognitive ability adequate to safely complete daily activities?: Yes Is the patient deaf or have difficulty hearing?: No Does the patient have difficulty seeing, even when wearing glasses/contacts?: No Does the patient have difficulty concentrating, remembering, or making decisions?: No Patient able to express need for assistance with ADLs?: Yes Does the patient have difficulty dressing or bathing?: No Independently performs ADLs?: Yes (appropriate for developmental age) Does the patient have difficulty walking or climbing stairs?: No Weakness of Legs: None Weakness of Arms/Hands: None  Permission Sought/Granted   Permission granted to share information with : Yes, Verbal Permission Granted              Emotional Assessment Appearance:: Appears stated age Attitude/Demeanor/Rapport: Engaged Affect (typically observed): Appropriate Orientation: : Oriented to Self, Oriented to Place, Oriented to  Time, Oriented to Situation Alcohol / Substance Use: Not Applicable Psych Involvement: No (comment)  Admission diagnosis:  Closed left tibial fracture [S82.202A] Closed fracture of distal end of left tibia, unspecified fracture morphology, initial encounter [S82.302A] Closed fracture of proximal end of left fibula, unspecified fracture morphology, initial encounter [J62.836O] Patient Active Problem List   Diagnosis Date Noted  . Closed left tibial fracture 01/22/2020   PCP:  Patient, No Pcp Per Pharmacy:   [id number] [place] [place]  [address] [place] [address] Phone: [phone] Fax: [phone]     Social Determinants of Health (SDOH) Interventions    Readmission Risk Interventions No flowsheet data found.

## 2021-11-19 NOTE — Progress Notes (Signed)
Patient pre-screened for BCCCP eligibility due to COVID 19 precautions. Two patient identifiers used for verification that I was speaking to correct patient.  Patient to Present directly to [facility] 11/20/21 for BCCCP screening mammogram.

## 2021-11-20 ENCOUNTER — Ambulatory Visit
Admission: RE | Admit: 2021-11-20 | Discharge: 2021-11-20 | Disposition: A | Discharge status: Home / Self Care | Payer: Self-pay | Source: Ambulatory Visit | Attending: Oncology | Admitting: Oncology

## 2021-11-20 ENCOUNTER — Ambulatory Visit: Payer: Self-pay | Attending: Oncology

## 2021-11-20 ENCOUNTER — Other Ambulatory Visit: Payer: Self-pay

## 2021-11-20 DIAGNOSIS — N644 Mastodynia: Secondary | ICD-10-CM

## 2021-11-22 NOTE — Progress Notes (Signed)
Radiologist reviewed [place] 1 findings with patient.  Patient  Instructed to return for annual screening. Copy to [facility].

## 2023-06-20 IMAGING — MG DIGITAL DIAGNOSTIC BILAT W/ TOMO W/ CAD
8 of 15 series · 8 of 40 positions shown · non-contrast
Comparison: None.

CLINICAL DATA: Patient presents for bilateral diagnostic
examination due to diffuse left breast pain.



[R ML synth-2D]
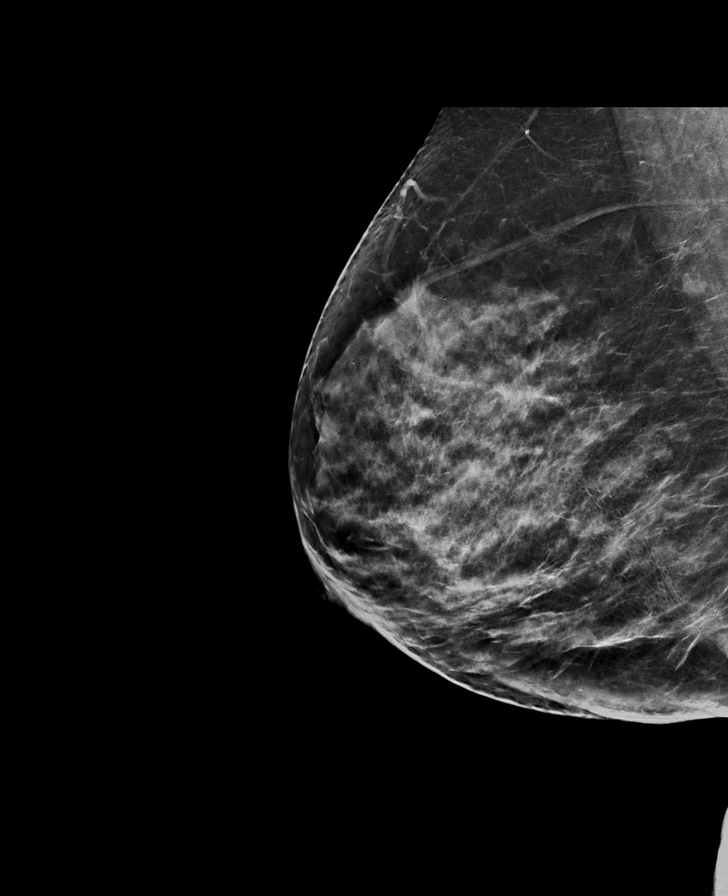

[R CC synth-2D]
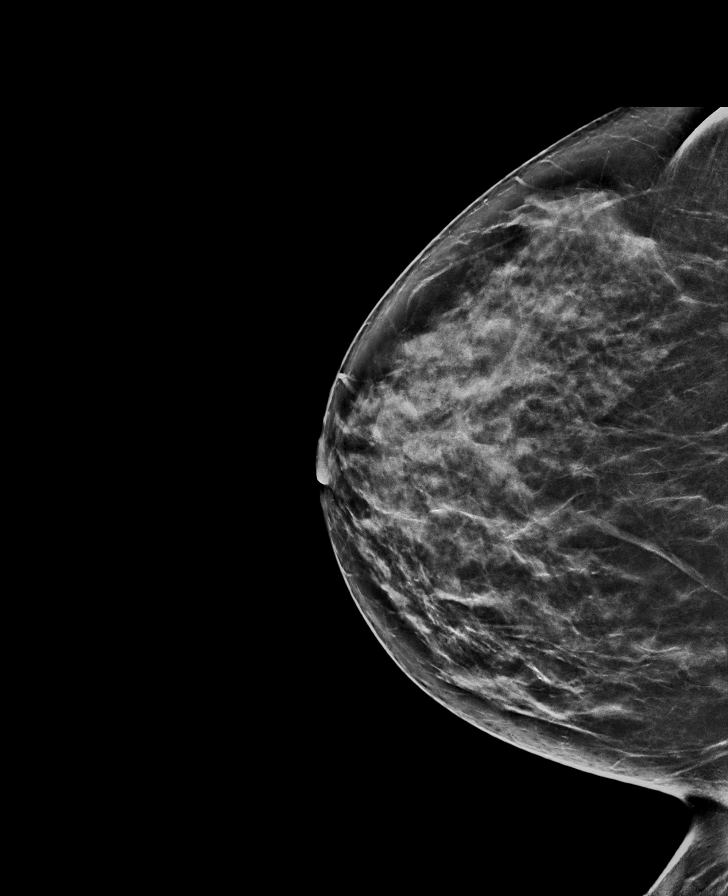

[L MLO synth-2D (1 of 2)]
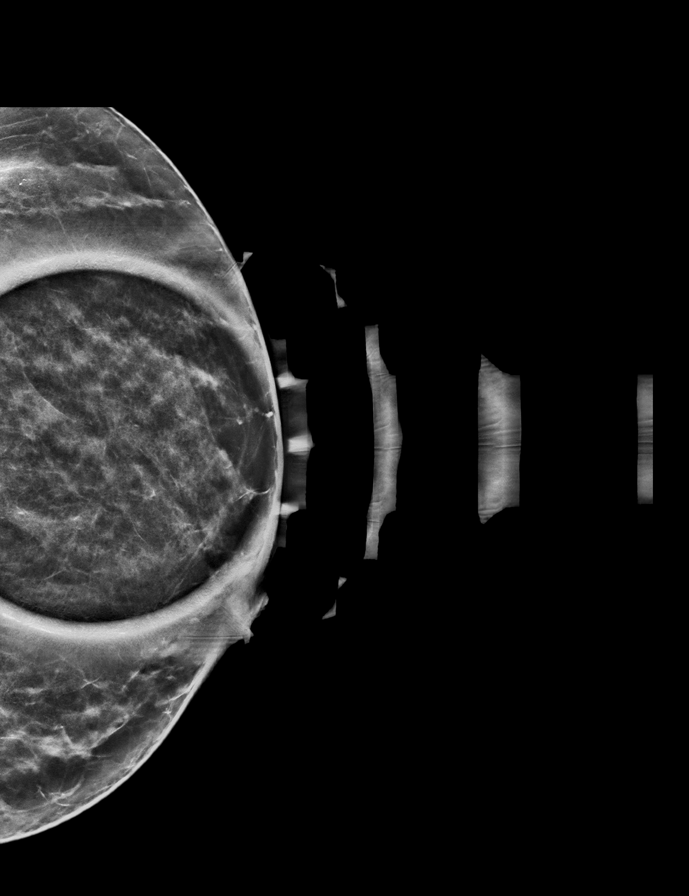

[R MLO synth-2D (1 of 2)]
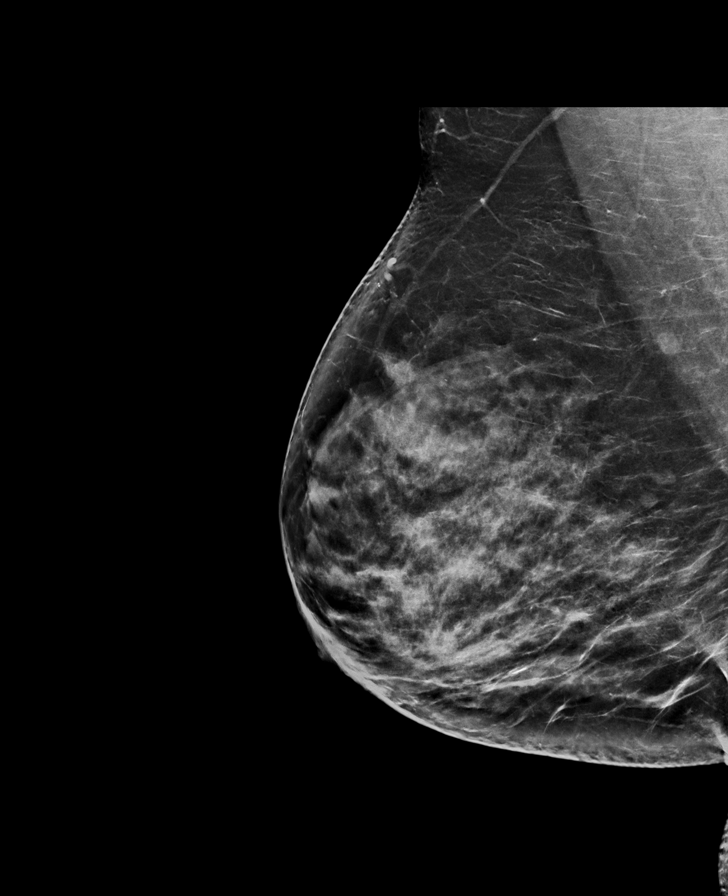

[L MLO synth-2D (2 of 2)]
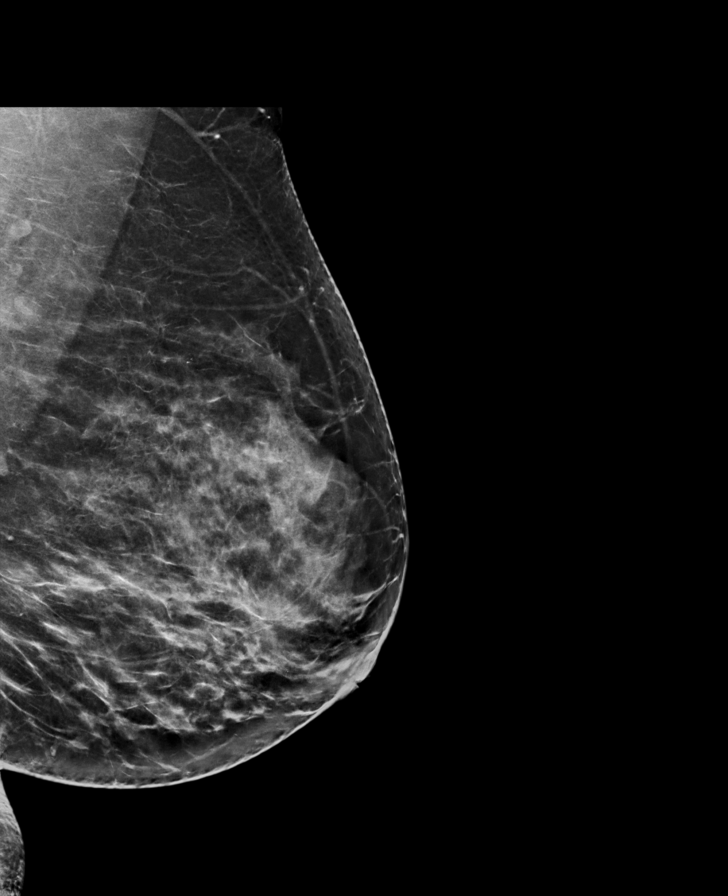

[L CC synth-2D]
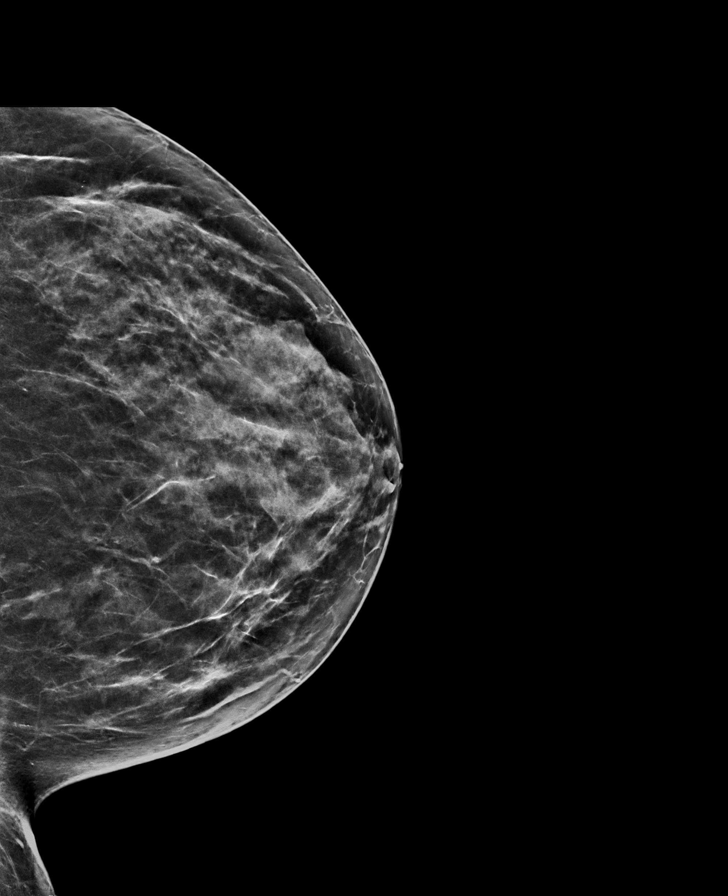

[R MLO synth-2D (2 of 2)]
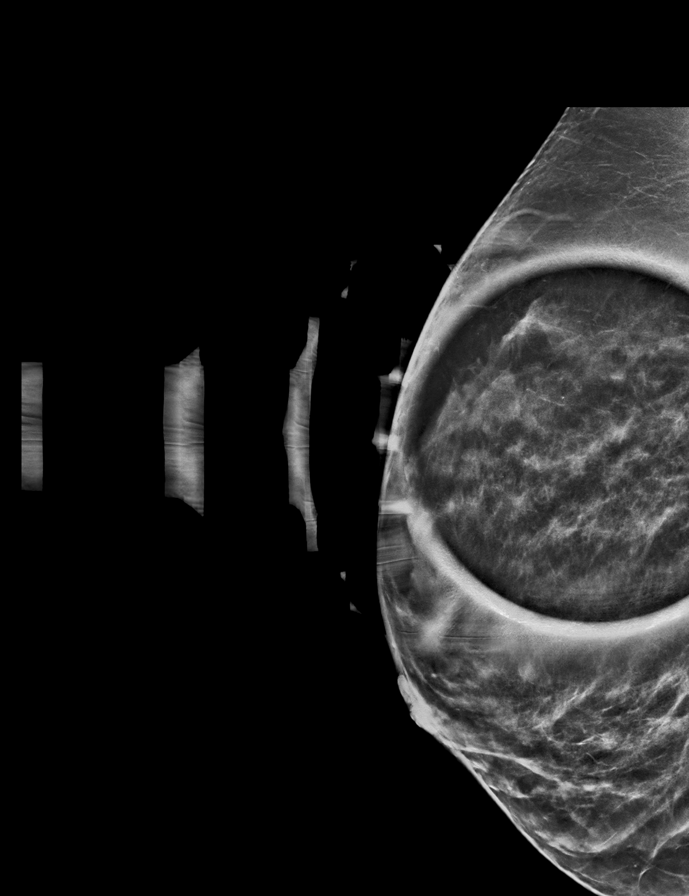

[R MLO tomo · tomo slice 57/83.0]
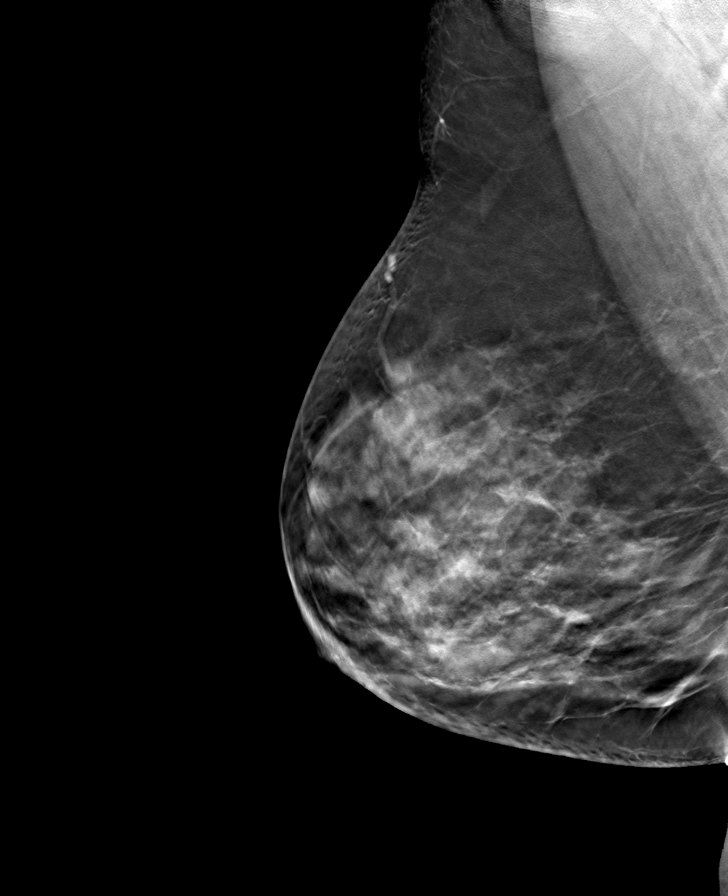

[8 of 40 positions shown; findings below may reference images not displayed]

ACR Breast Density Category c: The breast tissue is heterogeneously
dense, which may obscure small masses.
FINDINGS: Examination demonstrates a possible mass over the upper outer left
breast in the anterior third which does not persist on additional
images likely representing overlapping dense fibroglandular tissue.
There is a possible asymmetry over the upper right breast which is
less apparent and remains in the upper breast on the true lateral
image and likely represents overlapping dense fibroglandular tissue.
There is no corresponding abnormality in the outer breast on the CC
image.

Targeted ultrasound is performed, showing no focal abnormality over
the upper outer left breast.

Ultrasound over the upper-outer right breast demonstrates no focal
abnormality.
IMPRESSION: No focal abnormality within the left breast to account for patient's
diffuse left breast pain.

RECOMMENDATION:
Recommend continued management of patient's left breast pain on a
clinical basis. Otherwise, recommend continued annual bilateral
screening mammographic follow-up.

I have discussed the findings and recommendations with the patient.
If applicable, a reminder letter will be sent to the patient
regarding the next appointment.

BI-RADS CATEGORY  1: Negative.

## 2023-08-25 ENCOUNTER — Telehealth: Payer: Self-pay

## 2023-08-25 NOTE — Telephone Encounter (Signed)
Pt's daughter, Seward Meth, calling for pt who is having severe pelvic pain; has a hx of ovarian cyst; wants urgent appt.  [phone]  Lynann Beaver that if it has been longer than three years since we have seen the pt she is considered a new pt and we are not currently accepting new pt unless referred to [place].  Adv to take pt to the ER as they can do more for her today than we could anyway, we could see her as an ER f/u.

## 2024-03-30 ENCOUNTER — Other Ambulatory Visit: Payer: Self-pay | Admitting: Internal Medicine

## 2024-03-30 DIAGNOSIS — Z1231 Encounter for screening mammogram for malignant neoplasm of breast: Secondary | ICD-10-CM
